# Patient Record
Sex: Female | Born: 1955 | Race: White | Hispanic: No | Marital: Married | Smoking: Never smoker
Health system: Southern US, Academic
[De-identification: ages and names within clinical notes are randomized; demographics above are authoritative.]

## PROBLEM LIST (undated history)

## (undated) DIAGNOSIS — E11319 Type 2 diabetes mellitus with unspecified diabetic retinopathy without macular edema: Secondary | ICD-10-CM

## (undated) DIAGNOSIS — E139 Other specified diabetes mellitus without complications: Secondary | ICD-10-CM

## (undated) DIAGNOSIS — I10 Essential (primary) hypertension: Secondary | ICD-10-CM

## (undated) DIAGNOSIS — E785 Hyperlipidemia, unspecified: Secondary | ICD-10-CM

## (undated) DIAGNOSIS — E109 Type 1 diabetes mellitus without complications: Secondary | ICD-10-CM

## (undated) HISTORY — PX: TRIGGER FINGER RELEASE: SHX641

## (undated) HISTORY — PX: HX TONSILLECTOMY: SHX27

## (undated) HISTORY — DX: Type 2 diabetes mellitus with unspecified diabetic retinopathy without macular edema (CMS HCC): E11.319

## (undated) HISTORY — PX: HX WISDOM TEETH EXTRACTION: SHX21

## (undated) HISTORY — PX: ENDOMETRIAL ABLATION: SHX621

## (undated) HISTORY — DX: Other specified diabetes mellitus without complications (CMS HCC): E13.9

## (undated) HISTORY — DX: Hyperlipidemia, unspecified: E78.5

---
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
## 2003-02-22 ENCOUNTER — Ambulatory Visit (HOSPITAL_COMMUNITY): Payer: Self-pay

## 2004-02-20 ENCOUNTER — Ambulatory Visit (HOSPITAL_COMMUNITY): Payer: Self-pay

## 2005-03-01 ENCOUNTER — Ambulatory Visit (HOSPITAL_COMMUNITY): Payer: Self-pay

## 2006-08-14 ENCOUNTER — Other Ambulatory Visit (HOSPITAL_COMMUNITY): Payer: Self-pay

## 2007-03-24 ENCOUNTER — Ambulatory Visit
Admission: RE | Admit: 2007-03-24 | Discharge: 2007-03-24 | Disposition: A | Discharge status: Home / Self Care | Payer: BC Managed Care – PPO

## 2007-03-27 ENCOUNTER — Ambulatory Visit (HOSPITAL_COMMUNITY): Payer: Self-pay

## 2009-10-05 ENCOUNTER — Other Ambulatory Visit (HOSPITAL_COMMUNITY): Payer: Self-pay

## 2009-10-18 ENCOUNTER — Ambulatory Visit (HOSPITAL_COMMUNITY): Payer: BC Managed Care – PPO

## 2009-11-15 ENCOUNTER — Ambulatory Visit
Admission: RE | Admit: 2009-11-15 | Discharge: 2009-11-15 | Disposition: A | Discharge status: Home / Self Care | Payer: BC Managed Care – PPO

## 2009-11-15 DIAGNOSIS — Z1231 Encounter for screening mammogram for malignant neoplasm of breast: Secondary | ICD-10-CM | POA: Insufficient documentation

## 2009-11-16 ENCOUNTER — Other Ambulatory Visit (HOSPITAL_COMMUNITY): Payer: Self-pay

## 2009-11-22 ENCOUNTER — Ambulatory Visit
Admission: RE | Admit: 2009-11-22 | Discharge: 2009-11-22 | Disposition: A | Discharge status: Home / Self Care | Payer: BC Managed Care – PPO

## 2011-04-19 ENCOUNTER — Other Ambulatory Visit (HOSPITAL_COMMUNITY): Payer: Self-pay

## 2011-05-13 ENCOUNTER — Ambulatory Visit
Admission: RE | Admit: 2011-05-13 | Discharge: 2011-05-13 | Disposition: A | Discharge status: Home / Self Care | Payer: BC Managed Care – PPO | Source: Ambulatory Visit

## 2011-05-13 ENCOUNTER — Ambulatory Visit (HOSPITAL_BASED_OUTPATIENT_CLINIC_OR_DEPARTMENT_OTHER)
Admission: RE | Admit: 2011-05-13 | Discharge: 2011-05-13 | Disposition: A | Discharge status: Home / Self Care | Payer: BC Managed Care – PPO | Source: Ambulatory Visit

## 2011-05-13 DIAGNOSIS — Z13820 Encounter for screening for osteoporosis: Secondary | ICD-10-CM | POA: Insufficient documentation

## 2011-05-13 DIAGNOSIS — M899 Disorder of bone, unspecified: Secondary | ICD-10-CM | POA: Insufficient documentation

## 2011-05-13 DIAGNOSIS — Z780 Asymptomatic menopausal state: Secondary | ICD-10-CM | POA: Insufficient documentation

## 2011-05-13 DIAGNOSIS — Z1231 Encounter for screening mammogram for malignant neoplasm of breast: Secondary | ICD-10-CM | POA: Insufficient documentation

## 2011-05-13 DIAGNOSIS — M949 Disorder of cartilage, unspecified: Secondary | ICD-10-CM

## 2012-05-07 ENCOUNTER — Other Ambulatory Visit (HOSPITAL_COMMUNITY): Payer: Self-pay

## 2012-09-23 ENCOUNTER — Emergency Department
Admission: EM | Admit: 2012-09-23 | Discharge: 2012-09-23 | Disposition: A | Discharge status: Home / Self Care | Payer: BC Managed Care – PPO | Attending: Emergency Medicine | Admitting: Emergency Medicine

## 2012-09-23 ENCOUNTER — Emergency Department (EMERGENCY_DEPARTMENT_HOSPITAL): Payer: BC Managed Care – PPO | Admitting: Radiology

## 2012-09-23 ENCOUNTER — Encounter (HOSPITAL_COMMUNITY): Payer: Self-pay

## 2012-09-23 ENCOUNTER — Emergency Department (HOSPITAL_COMMUNITY): Payer: BC Managed Care – PPO

## 2012-09-23 DIAGNOSIS — E119 Type 2 diabetes mellitus without complications: Secondary | ICD-10-CM | POA: Insufficient documentation

## 2012-09-23 DIAGNOSIS — S93409A Sprain of unspecified ligament of unspecified ankle, initial encounter: Secondary | ICD-10-CM | POA: Insufficient documentation

## 2012-09-23 DIAGNOSIS — W010XXA Fall on same level from slipping, tripping and stumbling without subsequent striking against object, initial encounter: Secondary | ICD-10-CM | POA: Insufficient documentation

## 2012-09-23 DIAGNOSIS — S93609A Unspecified sprain of unspecified foot, initial encounter: Secondary | ICD-10-CM | POA: Insufficient documentation

## 2012-09-23 DIAGNOSIS — I10 Essential (primary) hypertension: Secondary | ICD-10-CM | POA: Insufficient documentation

## 2012-09-23 HISTORY — DX: Essential (primary) hypertension: I10

## 2012-09-23 MED ADMIN — oxyCODONE-acetaminophen 5 mg-325 mg tablet: 2 | ORAL | NDC 68084035511

## 2012-09-23 MED FILL — oxyCODONE-acetaminophen 5 mg-325 mg tablet: 2.0000 | ORAL | Qty: 2 | Status: AC

## 2012-09-23 NOTE — ED Nurses Note (Signed)
Discharge instructions explained. X prescriptions given. No further questions. Pt ambulated out of ED.

## 2012-09-23 NOTE — ED Attending Note (Signed)
Note begun by:  Serina Cowper, MD 09/23/2012, 6:07 AM    I was physically present and directly supervised this patient's care.  Patient seen and examined with Dr Cornelius Moras.  Resident history and exam reviewed.   Key elements in addition to and/or correction of that documentation are as follows:    HPI :    57 y.o. female presents with chief complaint of L ankle pain after tripping last night. Unable to bear weight.     PE :   VS on presentation: Blood pressure 148/88, pulse 106, temperature 37 C (98.6 F), resp. rate 18, height 1.549 m (5\' 1" ), weight 68.947 kg (152 lb), SpO2 98.00%.  I have seen and examined with Dr Cornelius Moras and agree with her exam as relayed verbally to me, except as subsequently noted.     Data/Test :    EKG : None  Images Review by me : see list  Image Reports Review by me : As above  Labs : None    Review of Prior Data :       Prior Images : None  Prior EKG : None  Online Medical Records : [facility]  Transfer Docs/Images : None    Clinical Impression :   1. Sprain L ankle and foot.      ED Course :   Per Dr Cornelius Moras     Plan :   Per Dr Cornelius Moras    Dispo :   Per Dr Cornelius Moras    CRITICAL CARE : None

## 2012-09-23 NOTE — ED Nurses Note (Signed)
Pt came to the ED with a C/O left ankle/ foot pain.  Pt was camping and tripped over her daughters shoe 2100 7/1.  The pain continued to get worse and she called the EMS. Pt is AAOx3.  MAE, +CMS to the LLE.  Pt was seen by the Provider.  Orders obtained for Xray.

## 2012-09-23 NOTE — ED Provider Notes (Signed)
CC: left ankle pain    HPI:  Isabella Terrell is a 57 y.o. female with history of HTN and DM who presents with left ankle pain s/p tripping over a shoe last night.  Reports immediate onset pain that has progressively worsened.  She was unable to bear weight when she woke up to use the restroom this morning.  Denies numbness or tingling.        ROS:  Constitutional: No fever or weakness   Skin: No rash or diaphoresis  HENT: No headaches or congestion  Eyes: No vision changes   Cardio: No chest pain, palpitations or leg swelling   Respiratory: No cough, wheezing or SOB  GI:  No nausea, vomiting, diarrhea, constipation  GU:  No dysuria, hematuria, polyuria  MSK: + left foot and ankle pain No back pain  Neuro: No loss of sensation, confusion, focal deficits  Psychiatric: No mood changes  All other systems reviewed and are negative.    Medications:  Medications Prior to Admission    Outpatient Medications    atorvastatin (LIPITOR) 10 mg Oral Tablet    Take 10 mg by mouth Once a day    lisinopril (PRINIVIL) 10 mg Oral Tablet    Take 10 mg by mouth Once a day    MetFORMIN (GLUCOPHAGE) 1,000 mg Oral Tablet    Take 1,000 mg by mouth Twice daily with food          Allergies:  Not on File  Allergies reviewed with patient    Past Medical History   Diagnosis Date   . HTN (hypertension)    . Diabetes mellitus      History reviewed with patient    History reviewed. No pertinent past surgical history.    History     Social History   . Marital Status: Married     Spouse Name: N/A     Number of Children: N/A   . Years of Education: N/A     Occupational History   . Not on file.     Social History Main Topics   . Smoking status: Never Smoker    . Smokeless tobacco: Not on file   . Alcohol Use: No   . Drug Use: No   . Sexually Active: Not on file     Other Topics Concern   . Not on file     Social History Narrative   . No narrative on file       No family history on file.    Physical Exam:  All nurse's notes reviewed.   ED Triage Vitals   Enc Vitals Group      BP (Non-Invasive) 09/23/12 0518 148/88 mmHg      Heart Rate 09/23/12 0518 106      Respiratory Rate 09/23/12 0518 18      Temperature 09/23/12 0518 37 C (98.6 F)      Temp src --       SpO2-1 09/23/12 0518 98 %      Weight 09/23/12 0518 68.947 kg (152 lb)      Height 09/23/12 0518 1.549 m (5\' 1" )      Head Cir --       Peak Flow --       Pain Score --       Pain Loc --       Pain Edu? --       Excl. in GC? --      Constitutional: NAD. Oriented  HENT:   Head: Normocephalic and atraumatic.   Mouth/Throat: Oropharynx is clear and moist.   Eyes: PERRL, EOMI, conjunctivae without discharge bilaterally  Neck: Trachea midline.   Cardiovascular: RRR, No murmurs, rubs or gallops.   Pulmonary/Chest: BS equal bilaterally, good air movement. No respiratory distress. No wheezes, rales or chest tenderness.   Abdominal: BS +. Abdomen soft, no tenderness, rebound or guarding.               Musculoskeletal: No edema, tenderness or deformity.  +TTP to medial arch of left foot, 5th metatarsal and anterior portion of lateral malleolus.  Denies TTP of tib/fib, knee, thigh or hip.  SILT.  Brisk cap refill  Skin: warm and dry. No rash, erythema, pallor or cyanosis  Psychiatric: normal mood and affect. Behavior is normal.   Neurological: Alert&Ox3. Grossly intact.     Labs:  none    Imaging:  Results for orders placed during the hospital encounter of 09/23/12 (from the past 72 hour(s))   XR ANKLE LEFT     Status: Normal    Narrative:     XR ANKLE LEFT performed on Neldon Mc Knieriem on Sep 23, 2012  6:17 AM.     CLINICAL HISTORY: 56 y.o. female with  twisted ankle.    TECHNIQUE: XR ANKLE LEFT    COMPARISON: None    FINDINGS: 3 views of the left ankle were obtained.  The left ankle mortise   and syndesmosis are well-preserved.  No fracture, dislocation,   subluxation, or destructive lesion is present.  No joint effusion is   present.  No significant soft tissue swelling is present.       Impression:          No acute osseous abnormality.             XR FOOT WEIGHT BEARING LT     Status: Normal    Narrative:     XR FOOT WEIGHT BEARING LT performed on Neldon Mc Klapper on Sep 23, 2012    6:17 AM.     CLINICAL HISTORY: 57 y.o. female with  ankle injury.    TECHNIQUE: XR FOOT WEIGHT BEARING LT    COMPARISON: None.    FINDINGS: 3 views of the left foot were obtained.  The forefoot, midfoot,   and hindfoot alignment are well preserved.  No fracture, dislocation,   subluxation, or destructive lesion is present.  An accessory navicular is   present.  No significant soft tissue swelling is present in the left foot.      Impression:          No acute osseous abnormality.             The differential diagnosis for this patient includes acute ankle fracture vs strain. The following orders have been placed.    Orders Placed This Encounter   . XR ANKLE LEFT   . XR FOOT WEIGHT BEARING LT   . oxyCODONE-acetaminophen (PERCOCET) 5-325mg  per tablet   . HYDROcodone-acetaminophen (NORCO) 5-325 mg Oral Tablet     Plan: Appropriate labs and imaging ordered. Medical Records reviewed.    Therapy/Procedures/Course/MDM: Patient was vitally stable throughout visit.  Required  medical therapy for pain control. Patient had  improvement in symptoms over course of ED stay.  Imaging showed no acute bony abnormality.  Results were discussed with patient. They were given an opportunity to ask questions.  Consults: none  Impression: ankle sprain  Disposition:       Following  the above history, physical exam, and studies, the patient was deemed stable and suitable for discharge and she will follow up in 1 week with PCP.  Follow up with orthopedics was also provided.  Prescriptions were written for norco.  She was placed in an ACE wrap and crutch training was provided.   Medication instructions were discussed with the patient/patient's family. Patient was advised to return to the ED with any new, concerning or worsening symptoms and follow up as directed. The patient verbalized understanding of all instructions and had no further questions or concerns.     Honor Loh, MD 09/23/2012, 5:27 AM  [facility]

## 2012-09-23 NOTE — ED Nurses Note (Signed)
Pt to Xray.

## 2012-10-12 ENCOUNTER — Encounter (INDEPENDENT_AMBULATORY_CARE_PROVIDER_SITE_OTHER): Payer: Self-pay | Admitting: ORTHOPEDIC, SPORTS MEDICINE

## 2012-10-12 ENCOUNTER — Ambulatory Visit: Payer: BC Managed Care – PPO | Attending: ORTHOPEDIC, SPORTS MEDICINE | Admitting: ORTHOPEDIC, SPORTS MEDICINE

## 2012-10-12 VITALS — BP 116/76 | HR 95 | Ht 61.0 in | Wt 148.8 lb

## 2012-10-12 DIAGNOSIS — S93629A Sprain of tarsometatarsal ligament of unspecified foot, initial encounter: Secondary | ICD-10-CM | POA: Insufficient documentation

## 2012-10-12 DIAGNOSIS — I10 Essential (primary) hypertension: Secondary | ICD-10-CM | POA: Insufficient documentation

## 2012-10-12 DIAGNOSIS — E119 Type 2 diabetes mellitus without complications: Secondary | ICD-10-CM | POA: Insufficient documentation

## 2012-10-12 NOTE — Progress Notes (Signed)
Name: Beatric Fulop [facility]  MEDICAL RECORD [id number]  DATE OF BIRTH: [birth year]  DATE OF SERVICE: 10/12/2012       SUBJECTIVE:  Isabella Terrell is a 57 y.o. year old who presents to clinic today for evaluation of her left foot.     Pain has been present for 3 weeks. There has been antecedent trauma. Date of Injury (DOI) = 7/2. Mechanism of injury was: forced plantarflexion and inversion.  The pain is localized to the lateral midfoot.  Worsened with weight bearing. Associated symptoms include: numbness over lateral midfoot. Prior treatments include: rest. This has helped.    PAST MEDICAL HISTORY  Past Medical History   Diagnosis Date   . HTN (hypertension)    . Diabetes mellitus       PAST SURGICAL HISTORY  No past surgical history on file.  MEDICATIONS  Current Outpatient Prescriptions   Medication Sig   . atorvastatin (LIPITOR) 10 mg Oral Tablet Take 10 mg by mouth Once a day   . lisinopril (PRINIVIL) 10 mg Oral Tablet Take 10 mg by mouth Once a day   . MetFORMIN (GLUCOPHAGE) 1,000 mg Oral Tablet Take 1,000 mg by mouth Twice daily with food   . SITAGLIPTIN PHOSPHATE (JANUVIA ORAL) Take by mouth     ALLERGIES  Not on File  SOCIAL HISTORY  History     Social History   . Marital Status: Married     Spouse Name: N/A     Number of Children: N/A   . Years of Education: N/A     Occupational History   . Not on file.     Social History Main Topics   . Smoking status: Never Smoker    . Smokeless tobacco: Not on file   . Alcohol Use: No   . Drug Use: No   . Sexually Active: Not on file     Other Topics Concern   . Not on file     Social History Narrative   . No narrative on file     FAMILY HISTORY  No family history on file.  REVIEW OF SYSTEMS  As above, otherwise all other systems are essentially negative.    PHYSICAL EXAM  BP 116/76   Pulse 95   Ht 1.549 m (5\' 1" )   Wt 67.5 kg (148 lb 13 oz)   BMI 28.13 kg/m2    In no acute distress with appropriate mood and affect.  She is alert and oriented times three.  HEENT: NC/AT Pupils are equal and round.  PULM: respirations are unlabored.  CV: Pulses palpable and equal distally.  GI: Abdomen is nondistended.  DERM: Skin is warm and dry to touch.    left ankle, foot:  Range of Motion: full  Strength: 4/5 with eversion, otherwise full  Tenderness to Palpation: base of 4th and 5th MT  Swelling: minimal  Special Test: ligaments stable  Neurologic: decreased sensation over lateral midfoot, otherwise intact  Gait: within normal limits     RADIOGRAPHY:  Plain film x-rays of the patient's left ankle, foot were reviewed in clinic today and show no acute bony abnormalities.    ASSESSMENT:     ICD-9-CM    1. Sprain of tarsometatarsal ligament of foot 845.11 DME CUSTOM MOLDED ORTHOSIS (REQUISITION/REQ     PLAN:  At today's visit, we discussed treatment options concerning patient's left foot.  Midfoot sprain.  Recommend rigid insert for additonal supprot.  Seems to be healing well.    She  will f/u in clinic in 3 wks if not improving as anticipated..  If the patient has any questions or concerns prior to next visit, she can contact our office.        Hilda Lias, MD 10/12/2012, 3:52 PM

## 2013-04-01 ENCOUNTER — Other Ambulatory Visit (HOSPITAL_COMMUNITY): Payer: Self-pay

## 2013-04-01 DIAGNOSIS — Z1231 Encounter for screening mammogram for malignant neoplasm of breast: Secondary | ICD-10-CM

## 2013-07-17 ENCOUNTER — Ambulatory Visit (HOSPITAL_COMMUNITY): Payer: Self-pay | Admitting: Family

## 2013-09-07 ENCOUNTER — Ambulatory Visit
Admission: RE | Admit: 2013-09-07 | Discharge: 2013-09-07 | Disposition: A | Discharge status: Home / Self Care | Payer: BC Managed Care – PPO | Source: Ambulatory Visit

## 2013-09-07 DIAGNOSIS — Z1231 Encounter for screening mammogram for malignant neoplasm of breast: Secondary | ICD-10-CM | POA: Insufficient documentation

## 2013-09-07 DIAGNOSIS — N6489 Other specified disorders of breast: Secondary | ICD-10-CM | POA: Insufficient documentation

## 2014-08-09 ENCOUNTER — Encounter (HOSPITAL_COMMUNITY): Payer: Self-pay

## 2014-08-10 ENCOUNTER — Encounter (HOSPITAL_COMMUNITY): Payer: Self-pay

## 2014-10-10 ENCOUNTER — Encounter (HOSPITAL_COMMUNITY): Payer: Self-pay

## 2015-10-10 ENCOUNTER — Other Ambulatory Visit (HOSPITAL_COMMUNITY): Payer: Self-pay

## 2015-10-10 DIAGNOSIS — Z1231 Encounter for screening mammogram for malignant neoplasm of breast: Secondary | ICD-10-CM

## 2016-07-10 ENCOUNTER — Ambulatory Visit
Admission: RE | Admit: 2016-07-10 | Discharge: 2016-07-10 | Disposition: A | Discharge status: Home / Self Care | Payer: No Typology Code available for payment source | Source: Ambulatory Visit

## 2016-07-10 ENCOUNTER — Encounter (HOSPITAL_COMMUNITY): Payer: Self-pay

## 2016-07-10 DIAGNOSIS — Z1231 Encounter for screening mammogram for malignant neoplasm of breast: Secondary | ICD-10-CM | POA: Insufficient documentation

## 2016-12-17 ENCOUNTER — Encounter (HOSPITAL_BASED_OUTPATIENT_CLINIC_OR_DEPARTMENT_OTHER): Payer: Self-pay | Admitting: "Endocrinology

## 2016-12-17 ENCOUNTER — Ambulatory Visit (HOSPITAL_BASED_OUTPATIENT_CLINIC_OR_DEPARTMENT_OTHER): Payer: No Typology Code available for payment source

## 2016-12-17 ENCOUNTER — Ambulatory Visit: Payer: No Typology Code available for payment source | Attending: "Endocrinology | Admitting: "Endocrinology

## 2016-12-17 VITALS — BP 142/102 | HR 98 | Ht 61.0 in | Wt 156.7 lb

## 2016-12-17 DIAGNOSIS — E119 Type 2 diabetes mellitus without complications: Secondary | ICD-10-CM

## 2016-12-17 DIAGNOSIS — E041 Nontoxic single thyroid nodule: Secondary | ICD-10-CM | POA: Insufficient documentation

## 2016-12-17 DIAGNOSIS — I10 Essential (primary) hypertension: Secondary | ICD-10-CM | POA: Insufficient documentation

## 2016-12-17 DIAGNOSIS — E1142 Type 2 diabetes mellitus with diabetic polyneuropathy: Secondary | ICD-10-CM | POA: Insufficient documentation

## 2016-12-17 DIAGNOSIS — E785 Hyperlipidemia, unspecified: Secondary | ICD-10-CM | POA: Insufficient documentation

## 2016-12-17 DIAGNOSIS — E1165 Type 2 diabetes mellitus with hyperglycemia: Secondary | ICD-10-CM | POA: Insufficient documentation

## 2016-12-17 DIAGNOSIS — Z794 Long term (current) use of insulin: Secondary | ICD-10-CM | POA: Insufficient documentation

## 2016-12-17 DIAGNOSIS — Z79899 Other long term (current) drug therapy: Secondary | ICD-10-CM | POA: Insufficient documentation

## 2016-12-17 DIAGNOSIS — E11319 Type 2 diabetes mellitus with unspecified diabetic retinopathy without macular edema: Secondary | ICD-10-CM | POA: Insufficient documentation

## 2016-12-17 LAB — BASIC METABOLIC PANEL
ANION GAP: 11 mmol/L (ref 4–13)
BUN/CREA RATIO: 16 (ref 6–22)
BUN: 13 mg/dL (ref 8–25)
CALCIUM: 10.4 mg/dL — ABNORMAL HIGH (ref 8.5–10.2)
CHLORIDE: 100 mmol/L (ref 96–111)
CO2 TOTAL: 26 mmol/L (ref 22–32)
CREATININE: 0.81 mg/dL (ref 0.49–1.10)
CREATININE: 0.81 mg/dL (ref 0.49–1.10)
ESTIMATED GFR: >59 mL/min/1.73mˆ2 (ref >59)
GLUCOSE: 466 mg/dL (ref 65–139)
POTASSIUM: 4 mmol/L (ref 3.5–5.1)
SODIUM: 137 mmol/L (ref 136–145)

## 2016-12-17 LAB — MICROALBUMIN/CREATININE RATIO, URINE, RANDOM
MICROALBUMIN RANDOM URINE: 0.7 mg/dL (ref 0.0–2.0)
MICROALBUMIN/CREATININE RATIO RANDOM URINE: 22.6 mg/g (ref <=30.0)

## 2016-12-17 LAB — THYROID STIMULATING HORMONE (SENSITIVE TSH): TSH: 1.887 u[IU]/mL (ref 0.350–5.000)

## 2016-12-17 MED ORDER — GLUCAGON (HUMAN RECOMBINANT) 1 MG/ML SOLUTION FOR INJECTION: 1 mg | Kit | Freq: Once | INTRAMUSCULAR | 3 refills | 0 days | Status: AC | PRN

## 2016-12-17 MED ORDER — LANCETS 33 GAUGE
3 refills | Status: AC
Start: 2016-12-17 — End: ?

## 2016-12-17 MED ORDER — BLOOD SUGAR DIAGNOSTIC STRIPS
ORAL_STRIP | 3 refills | Status: AC
Start: 2016-12-17 — End: ?

## 2016-12-17 MED ORDER — FLASH GLUCOSE SENSOR KIT: Each | 3 refills | 0 days | Status: DC

## 2016-12-17 MED ORDER — PEN NEEDLE, DIABETIC 31 GAUGE X 3/16": Each | 3 refills | 0 days | Status: DC

## 2016-12-17 MED ORDER — FLASH GLUCOSE SCANNING READER: Each | 0 refills | 0 days | Status: DC

## 2016-12-17 NOTE — Patient Instructions (Signed)
We will continue your Januvia 100 mg daily and increase metformin to 1000 mg twice a day.   Continue Lantus 24 units nightly but I want you to check your sugars every morning and increase your Lantus by 2 units every 2 days until your morning sugars are less than 140. If you feel really bad with your sugars in the 140s, then we can aim for morning sugars in the 160s-180s instead.

## 2016-12-17 NOTE — H&P (Signed)
Isabella Terrell  NEW PATIENT H&P    Referral Reason:  Type II Diabetes Mellitus   Referring Provider: Peri Jefferson  PCP: Peri Jefferson    HPI:   Isabella Terrell is a 61 y.o. female with PMH of HTN, HLD who presents as a referral from her gynecologist due to recurrent UTI/yeast infections for evaluation and management of poorly-controlled type II diabetes mellitus. Patient was diagnosed with type II diabetes mellitus about 25 years ago after presenting to her PCP with complaint of polyuria. She has never been hospitalized for high or low sugars. Patient was having multiple UTI and yeast infections so her gynecologist checked her HgA1C on 11/21/16 which was elevated to 12.6%. She does not check her sugars. She does not own a glucometer and states that she has expired test strips. She does not think she ever gets hypoglycemia as she used to have problems with hypoglycemia 15-17 years ago and would feel woozy and eyes would glaze over. She does not have a glucagon kit and when she would get low many years ago, she was able to treat them with food and juice. She reports numbness/tingling in her feet but reports good sensation and denies wounds. She gets yearly diabetes eye exams with Dr. Lamona Curl in [place], [place] and has diabetic retinopathy for which she receives eye injections. She is currently taking metformin 1000 mg qPM (states she frequently forgets to take it), Januvia 100 mg qPM, and Lantus 24 units qPM. She has been having social difficulties lately as she is currently retired and caring for 4 children (one is her grandchild, one is an adopted daughter, and two are children she has temporary custody for). She states that the children are not well behaved and she has difficulty with them. She used to be more active and walk about 10,000-13,000 steps a day but now is walking about 7,000 steps a day. She eats about 4 times a day but does not eat much. She does not cook much as she is busy with  the kids so they eat lots of easy to make things like sandwiches and waffles. She does enjoy eating salads. She is worried about gaining weight with insulin adjustments but states her weight is stable at this time. She is interested in seeing diabetes education/dietician. She states she has seen Dr. Alphonzo Dublin at [facility] in the past but does not have a reason why she did not keep her follow ups.     DIABETIC COMPLICATIONS:  Microvascular Complications:    Peripheral/Autonomic Neuropathy: +Mild peripheral neuropathy, not currently on medication   Renal: Last GFR- none on file, Microalb/crt ratio- none on file. Not currently on ACEI/ARB   Retinopathy: Last eye exam 06/2016. Has diabetic retinopathy requiring eye injections. Follows with Dr. Lamona Curl in [place], [place]  Macrovascular Complications:   Hx of CAD: No   Hx of statin/ASA: On Lipitor, no aspirin; SGLT2 or GLP-1 therapy? No   Hx of Stroke: No   Hx of HTN: Yes   Hx of HLD: Yes    CURRENT ANTI-DIABETIC REGIMEN:  Patient is adherent to meds   Januvia 100 mg qPM   Metformin 1000 mg qPM   Lantus 24 units qPM    PREVIOUS ANTI-DIABETIC MEDS:   None    BLOOD GLUCOSE TRENDS: Testing BG 0 times per day   Time Readings   Before Breakfast None   Before Lunch None   Before Supper None   Bedtime  None     HYPOGLYCEMIA  EVALUATION:  Is it occurring? Symptoms? Unsure but doesn't think so; symptoms in past 15-17 years ago were feeling woozy and eyes glazing over   Hypoglycemic Event: (Date/Time) 15-17 years ago   Treatment: Food, juice   Assisted or Unassisted: Unassisted   Hypoglycemic unawareness? Not in past but unsure now     NUTRITION:  Last Visit to Diabetic Education: Many years ago   Diet:  Tries to eat easy to make foods like sandwiches, waffles. Eats salads   Is patient drinking sugar sweetened beverages? Not much     WEIGHT:  Baseline adult weight    Current weight  Body mass index is 29.62 kg/(m^2).      REVIEW OF SYSTEMS:  In addition to HPI...  Constitutional:  negative  Eyes: negative  Ears, nose, mouth, throat, and face: negative  Respiratory: negative  Cardiovascular: negative  Gastrointestinal: negative  Genitourinary:negative  Integument/breast: negative  Hematologic/lymphatic: negative  Musculoskeletal:negative  Neurological: negative  Behavioral/Psych: negative  Endocrine: negative  Allergic/Immunologic: negative  All other ROS Negative    Past Medical History:   Diagnosis Date   . HLD (hyperlipidemia)    . HTN (hypertension)    . Type II diabetes mellitus (CMS HCC)      Past Surgical History:   Procedure Laterality Date   . ENDOMETRIAL ABLATION     . HX WISDOM TEETH EXTRACTION     . TRIGGER FINGER RELEASE       Social History     Social History   . Marital status: Married     Spouse name: N/A   . Number of children: N/A   . Years of education: N/A     Occupational History   . Not on file.     Social History Main Topics   . Smoking status: Never Smoker   . Smokeless tobacco: Never Used   . Alcohol use No   . Drug use: No   . Sexual activity: Not on file     Other Topics Concern   . Not on file     Social History Narrative     Family Medical History     Problem Relation (Age of Onset)    Diabetes type II Father, Brother, Paternal Uncle, Sister    Hypothyroidism Mother, Sister    Multiple Sclerosis Brother        Current Outpatient Prescriptions   Medication Sig   . atorvastatin (LIPITOR) 10 mg Oral Tablet Take 20 mg by mouth Once a day    . Blood Sugar Diagnostic (ONETOUCH VERIO) Strip To check sugars 4 times daily as directed   . flash glucose scanning reader (FREESTYLE LIBRE 10 DAY READER) Does not apply Misc To check sugars 4 times daily as directed   . flash glucose sensor (FREESTYLE LIBRE 10 DAY SENSOR) Does not apply Kit To check sugars 4 times daily as directed   . glucagon (GLUCAGEN) 1 mg/mL Injection Recon Soln 1 mg by Intramuscular route Once, as needed for Other (Blood sugar less than 70 and cannot eat) for up to 1 dose   . insulin glargine (LANTUS  SOLOSTAR U-100 INSULIN) 100 unit/mL Subcutaneous Insulin Pen 24 Units by Subcutaneous route Every night   . Insulin Needles, Disposable, (BD ULTRAFINE III SHORT PEN) 31 gauge x 3/16" Needle As needed daily with Lantus   . lancets (ONE TOUCH DELICA) 33 gauge Misc To check sugars 4 times daily as directed   . lisinopril (PRINIVIL) 10 mg Oral Tablet Take 10 mg  by mouth Once a day   . MetFORMIN (GLUCOPHAGE) 1,000 mg Oral Tablet Take 1,000 mg by mouth Twice daily with food   . SITAGLIPTIN PHOSPHATE (JANUVIA ORAL) Take by mouth     No Known Allergies    PHYSICAL EXAM:   BP (!) 142/102  Pulse 98  Ht 1.549 m (_0 )  Wt 71.1 kg (156 lb 12 oz)  BMI 29.62 kg/m2   General: Well appearing female in no acute distress.  Psychiatric: Normal mood and affect  Neuro: Alert and oriented x 3. Speech fluent. Cranial nerves II-XII intact. No focal deficits noted. Sensation intact throughout.   HEENT: Head normocephalic, atraumatic. PERRLA, EOMI, conjunctiva clear. Oral mucosa pink and moist.   Neck: No lymphadenopathy or thyromegaly. Palpable right thyroid nodule.   Lungs: Clear to auscultation bilaterally. Normal respiratory effort.   Cardiac: Regular rate and rhythm, normal S1 and S2, without murmurs, rubs, or gallops.  Abdomen: Soft, non-tender, non-distended.   Extremities: No edema, erythema, warmth, or tenderness.  Skin: Warm and dry.  Diabetic foot exam:  Both feet without edema or ulcerations. Pulses normal bilaterally. Sensation normal bilaterally      IMAGING: N/A    LABS:  No results found for: HA1C, SODIUM, POTASSIUM, CHLORIDE, CO2, BUN, CREATININE, GLUCOSE, MICALBCRERAT, TRIG, HDLCHOL, LDLCHOL, CHOLESTEROL, AST, ALT    -Outside labs reviewed: HgA1C 12.6% on 11/21/16    ASSESSMENT:  Isabella Terrell is a 61 y.o. female with PMH of HLD who presents as a referral from her gynecologist due to recurrent UTI/yeast infections for evaluation and management of poorly-controlled type II diabetes mellitus.     PLAN:   Poorly-Controlled Type II Diabetes Mellitus  -Complicated by poor compliance with checking sugars, peripheral neuropathy, past microalbuminuria, diabetic retinopathy and macrovascular diseases including HTN, HLD  -Will check anti-GAD and anti-islet cell antibodies to evaluate for type 1 DM  -Most recent HgA1C 12.6% in 10/2016. Urine microalbumin/creatinine ratio- will check today. Not currently on ACEi/ARB but was on lisinopril in the past. If microalbumin/Cr ratio elevated, will restart lisinopril   -Most recent diabetes foot exam today, 12/17/2016  -Most recent diabetic eye exam 06/2016. Has diabetic retinopathy requiring eye injections. Follows with Dr. Lamona Curl in [place], [place]  -Hypoglycemia not occurring. Sent glucagon injection  -Will continue Januvia 100 mg qDaily and increase metformin to 1000 mg BID  -Will continue Lantus at 24 units qPM and instructed patient to check sugars every morning and increase her Lantus by 2 units every 2 days until blood sugars <140 most days. Instructed her that if she feels bad with sugars in the 140s, we can aim for a goal AM sugar of 160-180s for the next month or two if needed until her body acclimates to better sugars  -Provided with OneTouch Verio glucometer. Will send test strips, lancets, pen needles to pharmacy  -Will check BMP to evaluate kidney function and electrolytes  -Advised patient to start checking sugars 2x/day and to bring sugar logs and glucometer to clinic appointments  -Sent prescription for FreeStyle Libre sensors and reader  -Discussed patient with endocrine nurse, Selena Batten, and provided with glucometer     Return to diabetic education; ordered external referral   Hgba1c Goal <7%   Preprandial Glucose Target 80-130 mg/dL   Peak Postprandial Glucose Target <180 mg/dL    BP Goal: <130/80    Labs today: TSH, BMP, anti-GAD and islet cell antibodies, urine microalbumin/Cr ratio   Vaccines:   Flu vaccine annually- discuss with PCP  Hepatitis B- discuss with  PCP    Hepatitis B, 3-dose series to unvaccinated adults with DM>60 y/o    Hepatitis B, 3-dose series to unvaccinated adults with DM 26-59 y/o    Pneumonia- discuss with PCP    All patients with DM 2-64 y/o with pneumococcal polysaccharide vaccine (PPSV23)   At age 39 years, administer the pneumococcal conjugate vaccine (PCV 13) at least 1 year after vaccination with PPSV23, followed by another dose of vaccine PPSV23 at least 1 year after the last dose of PPSV23    Yearly eye and daily foot exams discussed   Symptoms of potential hypoglycemia discussed and treatment of hypoglycemia discussed   Return visit in 1 month or earlier if needed    The patient will be contacted once any lab work is resulted    CC: Notes to referring physician     Palpable Right Thyroid Nodule  -Ordered thyroid ultrasound. Will check TSH    Orders Placed This Encounter   . [facility] Thyroid US   . [facility] THYROID US   . Basic Metabolic Panel   . Tsh   . Microalbumin/Creatinine Ratio, Random Urine   . CANCELED: Microalbumin/Creatinine Ratio, Random Urine   . CANCELED: Tsh   . GAD65 AB ASSAY, S   . ISLET ANTIGEN 2 (IA-2) ANTIBODY, SERUM   . Referral to Diabetic Education   . glucagon (GLUCAGEN) 1 mg/mL Injection Recon Soln   . Insulin Needles, Disposable, (BD ULTRAFINE III SHORT PEN) 31 gauge x 3/16" Needle   . flash glucose scanning reader (FREESTYLE LIBRE 10 DAY READER) Does not apply Misc   . flash glucose sensor (FREESTYLE LIBRE 10 DAY SENSOR) Does not apply Kit   . lancets (ONE TOUCH DELICA) 59 gauge Misc   . Blood Sugar Diagnostic (ONETOUCH VERIO) Strip      Keith Rake, MD, MPH 12/17/2016, 13:07          I have seen this patient and reviewed the chart and examined the patient and agree with the above note. Shela Commons, MD 12/18/2016, 07:40

## 2016-12-18 ENCOUNTER — Ambulatory Visit
Payer: No Typology Code available for payment source | Attending: INTERNAL MEDICINE-ENDOCRINOLOGY-DIABETES AND METABOLISM

## 2016-12-18 ENCOUNTER — Other Ambulatory Visit (HOSPITAL_BASED_OUTPATIENT_CLINIC_OR_DEPARTMENT_OTHER): Payer: Self-pay | Admitting: "Endocrinology

## 2016-12-18 ENCOUNTER — Other Ambulatory Visit (HOSPITAL_COMMUNITY): Payer: Self-pay | Admitting: "Endocrinology

## 2016-12-18 DIAGNOSIS — E041 Nontoxic single thyroid nodule: Secondary | ICD-10-CM | POA: Insufficient documentation

## 2016-12-19 ENCOUNTER — Ambulatory Visit (HOSPITAL_BASED_OUTPATIENT_CLINIC_OR_DEPARTMENT_OTHER): Payer: Self-pay | Admitting: "Endocrinology

## 2016-12-19 NOTE — Telephone Encounter (Signed)
Regarding: Lab orders/RX question  ----- Message from Aldean Jewett sent at 12/19/2016 12:43 PM EDT -----  [place] [facility] [place] 1    Patient stated that the [facility] lab stated that they needed orders for patient to drop off her 24 hour urin catch. Patient also stated that with this one medication that she got that there is suppose to have a second part to it and that the pharmacy does not have it. The following medication is the one she was talking about. Please call to advise. Thank you.    glucagon (GLUCAGEN) 1 mg/mL Injection Recon Soln 1 Kit 3 12/17/2016      Sig - Route: 1 mg by Intramuscular route Once, as needed for Other (Blood sugar less than 70 and cannot eat) for up to 1 dose - Intramuscular    Class: E-Rx     Preferred Pharmacy     [id number]   [address] [place]   Phone: [phone]: [phone]   Not a 24 hour pharmacy; exact hours not known

## 2016-12-19 NOTE — Telephone Encounter (Signed)
Detailed vm left on pharmacy vm box for glucagon emergency kit. Message to provider to advise on urine testing as pt just did urine labs.  Enid Skeens, RN 12/19/2016 15:51

## 2016-12-20 ENCOUNTER — Telehealth (HOSPITAL_BASED_OUTPATIENT_CLINIC_OR_DEPARTMENT_OTHER): Payer: Self-pay | Admitting: "Endocrinology

## 2016-12-20 NOTE — Telephone Encounter (Signed)
[name], [name], MD  Asuncion Shibata, Lemar Livings, RN     Caller: Unspecified Burgess Estelle, 12:38 PM)                Jake Seats,     I did not order any urine tests except for the microalbumin/Cr ratio she already did yesterday. I'm not sure which urine test the lab is talking about.     Thank you,     Osborne Casco       Brief vm for return call to clinic.  Ernst Breach, RN 12/20/2016 09:01

## 2016-12-20 NOTE — Telephone Encounter (Signed)
Spoke with patient. Advised of note below. Verbalzied understanding. Denied any questions. Mitzy Naron, MA  12/20/2016, 15:59

## 2016-12-20 NOTE — Telephone Encounter (Signed)
-----   Message from Zadie Rhine, MD sent at 12/19/2016  8:03 AM EDT -----  TSH is good. Blood sugars elevated which we expected. Patient was instructed at appointment to start checking her sugars twice a day and start increasing Lantus from 24 units qPM by 2 units every 2 days until AM blood sugars are <140 or if she feels too bad with good sugars aim for AM sugars 160-180 for the first month or two. Urine microalbumin/Cr is okay. Will hold off on restarting lisinopril at this time. Suspect that she is indeed type 2 DM as her sugar was 466 without elevated anion gap. GAD and islet cell antibodies pending.     Gordy Levan, MD, MPH 12/19/2016, 08:03

## 2016-12-24 ENCOUNTER — Other Ambulatory Visit (HOSPITAL_COMMUNITY): Payer: Self-pay | Admitting: "Endocrinology

## 2016-12-24 DIAGNOSIS — E109 Type 1 diabetes mellitus without complications: Secondary | ICD-10-CM

## 2016-12-24 MED ORDER — INSULIN LISPRO (U-100) 100 UNIT/ML SUBCUTANEOUS PEN
5.00 [IU] | PEN_INJECTOR | Freq: Three times a day (TID) | SUBCUTANEOUS | 3 refills | Status: DC
Start: 2016-12-24 — End: 2017-03-11

## 2017-01-01 ENCOUNTER — Ambulatory Visit (HOSPITAL_BASED_OUTPATIENT_CLINIC_OR_DEPARTMENT_OTHER): Payer: Self-pay | Admitting: "Endocrinology

## 2017-01-01 NOTE — Telephone Encounter (Signed)
-----   Message from [facility] sent at 01/01/2017  1:12 PM EDT -----  Zadie Rhine, MD  Pt is calling needing to speak with you about her blood sugars running high.Please call pt back at [phone]

## 2017-01-01 NOTE — Telephone Encounter (Signed)
Spoke with patient and took log. Pt has Libre. Sugars ranging from 150-350. Pt states meter does not go past 350. She has d/ced Januvia and Metformin. She is on Lantus 30U and Humalog 6U TID. She states Lantus and Humalog is "jamming" and she has had to waste two pens. Pt states her pens inject however they do not dial all the way back to 0. Spoke with [employer]. Pt to open up new box and use pen from new box. Pt is not getting full dose of insulin when pen won't dial back to 0. Pt to call in one week with readings for adjustment. Advised pt. Also reviewed priming the pen with 2U with each injection to ensure pen needle is working and full dose of medication is being administered. If pt has issue with pen "jamming" again pt is to open new box and use new pen. Pt is to take faulty pens back to pharmacy. Company will replace pens. Pt verbalized understanding and will call in one week for adjustments.  Ernst Breach, RN 01/01/2017 15:14

## 2017-01-13 ENCOUNTER — Encounter (HOSPITAL_BASED_OUTPATIENT_CLINIC_OR_DEPARTMENT_OTHER): Payer: Self-pay | Admitting: "Endocrinology

## 2017-01-16 ENCOUNTER — Ambulatory Visit (HOSPITAL_BASED_OUTPATIENT_CLINIC_OR_DEPARTMENT_OTHER): Payer: No Typology Code available for payment source

## 2017-01-16 ENCOUNTER — Ambulatory Visit (HOSPITAL_BASED_OUTPATIENT_CLINIC_OR_DEPARTMENT_OTHER): Payer: No Typology Code available for payment source | Admitting: "Endocrinology

## 2017-01-16 ENCOUNTER — Encounter (HOSPITAL_BASED_OUTPATIENT_CLINIC_OR_DEPARTMENT_OTHER): Payer: Self-pay | Admitting: "Endocrinology

## 2017-01-16 ENCOUNTER — Ambulatory Visit
Admission: RE | Admit: 2017-01-16 | Discharge: 2017-01-16 | Disposition: A | Discharge status: Home / Self Care | Payer: No Typology Code available for payment source | Source: Ambulatory Visit | Attending: "Endocrinology | Admitting: "Endocrinology

## 2017-01-16 VITALS — BP 132/82 | HR 84 | Ht 61.0 in | Wt 155.0 lb

## 2017-01-16 DIAGNOSIS — E785 Hyperlipidemia, unspecified: Secondary | ICD-10-CM

## 2017-01-16 DIAGNOSIS — E041 Nontoxic single thyroid nodule: Secondary | ICD-10-CM | POA: Insufficient documentation

## 2017-01-16 DIAGNOSIS — E109 Type 1 diabetes mellitus without complications: Secondary | ICD-10-CM

## 2017-01-16 DIAGNOSIS — G629 Polyneuropathy, unspecified: Secondary | ICD-10-CM

## 2017-01-16 DIAGNOSIS — E1042 Type 1 diabetes mellitus with diabetic polyneuropathy: Secondary | ICD-10-CM | POA: Insufficient documentation

## 2017-01-16 DIAGNOSIS — E10319 Type 1 diabetes mellitus with unspecified diabetic retinopathy without macular edema: Secondary | ICD-10-CM | POA: Insufficient documentation

## 2017-01-16 DIAGNOSIS — E119 Type 2 diabetes mellitus without complications: Secondary | ICD-10-CM

## 2017-01-16 DIAGNOSIS — I10 Essential (primary) hypertension: Secondary | ICD-10-CM | POA: Insufficient documentation

## 2017-01-16 DIAGNOSIS — Z79899 Other long term (current) drug therapy: Secondary | ICD-10-CM | POA: Insufficient documentation

## 2017-01-16 LAB — BASIC METABOLIC PANEL
ANION GAP: 6 mmol/L (ref 4–13)
BUN/CREA RATIO: 17 (ref 6–22)
BUN: 12 mg/dL (ref 8–25)
CALCIUM: 9.6 mg/dL (ref 8.5–10.2)
CHLORIDE: 102 mmol/L (ref 96–111)
CO2 TOTAL: 30 mmol/L (ref 22–32)
CREATININE: 0.7 mg/dL (ref 0.49–1.10)
ESTIMATED GFR: >59 mL/min/1.73mˆ2 (ref >59)
GLUCOSE: 305 mg/dL — ABNORMAL HIGH (ref 65–139)
POTASSIUM: 4.1 mmol/L (ref 3.5–5.1)
SODIUM: 138 mmol/L (ref 136–145)

## 2017-01-16 LAB — ALBUMIN: ALBUMIN: 4 g/dL (ref 3.4–4.8)

## 2017-01-16 LAB — PARATHYROID HORMONE (PTH): PTH: 57.2 pg/mL (ref 8.5–75.0)

## 2017-01-16 MED ORDER — INSULIN GLARGINE (U-100) 100 UNIT/ML (3 ML) SUBCUTANEOUS PEN
PEN_INJECTOR | SUBCUTANEOUS | 5 refills | Status: DC
Start: 2017-01-16 — End: 2017-03-11

## 2017-01-16 MED ORDER — PEN NEEDLE, DIABETIC 32 GAUGE X 5/32"
3 refills | Status: DC
Start: 2017-01-16 — End: 2019-09-09

## 2017-01-16 NOTE — Progress Notes (Signed)
[place] NOTE    DEMOGRAPHICS    Name: Isabella Terrell, 61 y.o., female  MRN: [id number]  DOB: June 02, 1955  Date of Service: 01/16/2017    Marital Status: Married    Occupation: Pharmacist, hospital    Education Level: Conservator, museum/gallery +     Healthcare literacy:  If you need to go to the doctor, clinic or hospital, how confident are you in filling out the medical forms by yourself?  Quite confident    How often do you have someone else (family member or staff at the clinic or hospital) help you to read health or medical forms?  Never    How often do you have problems learning about your health because of trouble understanding written health information:  Never    How often do you have trouble understanding what you doctor, nurse or pharmacist (druggist) tells you about your health or about treatments?  Never    How often do you have trouble remembering instructions from the doctor, nurse or pharmacist (druggist) after you get home?  Never    What would help you best understand and remember the information you are getting about your health?  Combination of reading, listening, observing and doing    Barriers to Learning:  Visual    Healthcare Numeracy: 3/3    Primary Care Physician: Juan Quam., MD    Race: White      HISTORY    Type of diabetes: Type 1 DM    Past Medical History:   Diagnosis Date   . Diabetic retinopathy (CMS [place])    . HLD (hyperlipidemia)    . HTN (hypertension)    . Type I diabetes mellitus (CMS [facility])      Current Outpatient Prescriptions   Medication Sig   . atorvastatin (LIPITOR) 10 mg Oral Tablet Take 20 mg by mouth Once a day    . Blood Sugar Diagnostic (ONETOUCH VERIO) Strip To check sugars 4 times daily as directed   . flash glucose scanning reader (FREESTYLE LIBRE 10 DAY READER) Does not apply Misc To check sugars 4 times daily as directed   . flash glucose sensor (FREESTYLE LIBRE 10 DAY SENSOR) Does not apply Kit To check sugars 4 times daily as directed   . glucagon (GLUCAGEN) 1  mg/mL Injection Recon Soln 1 mg by Intramuscular route Once, as needed for Other (Blood sugar less than 70 and cannot eat) for up to 1 dose   . insulin glargine (LANTUS SOLOSTAR U-100 INSULIN) 100 unit/mL Subcutaneous Insulin Pen Take up to 70 units nightly as instructed   . insulin lispro (HUMALOG KWIKPEN INSULIN) 100 unit/mL Subcutaneous Insulin Pen 5 Units by Subcutaneous route Three times daily before meals   . Insulin Needles, Disposable, (PEN NEEDLE) 32 gauge x 5/32" Needle To inject 4 times per day   . lancets (ONE TOUCH DELICA) 33 gauge Misc To check sugars 4 times daily as directed   . lisinopril (PRINIVIL) 10 mg Oral Tablet Take 10 mg by mouth Once a day   . [DISCONTINUED] insulin glargine (LANTUS SOLOSTAR U-100 INSULIN) 100 unit/mL Subcutaneous Insulin Pen 24 Units by Subcutaneous route Every night     ** Medications highlighted above were reviewed during visit.     History   Alcohol Use No     History   Smoking Status   . Never Smoker   Smokeless Tobacco   . Never Used       VISIT NOTES    Reason  for Visit: Session 1 of RD/MNT for diabetes. self-management skills    Diabetes Needs Assessment for Education: Disease process, Nutritional management, Physical Activity, Medications, Monitoring, Behavior change strategies, Risk reduction strategies, Psychosocial strategies    Session Type: Individual      Vital Signs:    BP (Non-Invasive): 132/82    Weight: 69.9 kg (154 lb)  Height: 154.9 cm (5' 1" )  Body mass index is 29.1 kg/(m^2).    Per Isabella Terrell, last A1c obtained in August of this year was 12.6% which was reviewed with her during visit. Discussed the ADA target goal.       PATIENT REPORTS:      When were you diagnosed with diabetes?  About 31 years ago    Do you test your blood sugar?  Yes   Isabella Terrell reports that she recently received her Freestyle Libre fast glucose monitoring device (which patient states that she loves) and has been obtaining readings about 7 times a day on average.  Reports having a typical average of about 270 mg/dL.    Number of hypoglycemic episodes in the past 2 weeks?  None reported     Do you take your medicine?  Yes   INCREASED insulin dosages -   50 units of Lantus nightly (previously 38 units)  8 units of Humalog + sliding scale coverage before meals (previously 5 units and no sliding scale) (will be using 1 unit for every 50>150 mg/dL)    Do you follow a meal plan to manage diabetes?  No    Do you exercise?  Yes    Have you had a dilated eye exam or iris exam in the past 12 months?  Yes    Have you had your feet examined in the past 12 months?  Yes    Have you been to the dentist in the past 12 months?  Yes    Do you smoke or use tobacco?  No   If yes, do you want to quit:  N/A    Do you have questions or concerns about diabetes that you would like to talk about privately?  No    Are you ready to make changes to manage your diabetes?  Yes      MEAL PLANNING:  From review of usual eating habits and lifestyle, it appears that Isabella Terrell is eating the following:  Breakfast: eggs / coffee  Lunch: tuna and crackers / homemade soups  Dinner: salads (with chicken, nuts as a protein source)   Snack(s): nuts / Kind bars / fruit / string cheese / Skinny Pop popcorn   Beverages consumed throughout the day: water / coffee / iced tea with an "inch of sugar"    Isabella Terrell reports that she recently (within the past month) has been limiting/avoiding CHOs as much as possible because she does not want to have to take any insulin. Discussed the importance of getting a combination of the macronutrients with each meal and the need for insulin as she has recently been told she is type 1.    Based on usual eating habits and weight goals recommend 135 - 165 grams carbohydrate spread throughout the day in the following pattern:    Breakfast: 30 - 45 g  Snack: <15 g  Lunch: 30 - 45 g  Snack: <15 g  Dinner: 45 g  Snack: 15 g    Introduced and explained meal plan with emphasis on  spreading the carbohydrates grams throughout  the day. Reviewed calories, carbohydrate counting, food models, portion sizes, menu ideas, healthy snacks and food labels.      Isabella Terrell was also given a Calorie Edison Pace book for looking up total CHOs being consumed. We practiced looking up some of her favorite restaurant meals as she shared that she does often go out to eat.      GOALS:    Isabella Terrell chose the following self care goals: Eating better and Being active.      Diabetes Distress Screening Scale: A Slight Problem    Reviewed psychosocial issues and developed strategies.      KNOWLEDGE TEST:    Knowledge Pre-test: 3 out of 4    Diabetes education completed at this time.    Diabetes Self-Management Support Plan reviewed.   Individual plan: Diabetes Education, Diabetes Website    Next scheduled appointment: Follow up appointment scheduled for November 29 at 1000.         Shirlee Latch, RDLD 01/16/2017, 13:35  Total time spent with RD / Diabetes Educator: 120 minutes    The above note will be routed via EPIC to the referring provider.

## 2017-01-16 NOTE — Patient Instructions (Addendum)
You will increase your Lantus to 50 units nightly starting tonight then increase by 2 units every 2 days until morning sugars between 80 and 140 most days.  If your morning sugars are below 80, decrease your Lantus by 2 units every 2 days until morning sugars are between 80-140  For your mealtime insulin (Humalog), you will take 8 units three times a day with meals plus sliding scale as follows:  If your pre-meal blood sugar is 150: Take 0 extra units (total 8 units)  If your pre-meal blood sugar is 151-200: Take 1 extra units (total 9 units)  If your pre-meal blood sugar is 201-250: Take 2 extra units (total 10 units)  If your pre-meal blood sugar is 251-300: Take 3 extra units (total 11 units)  If your pre-meal blood sugar is 301-350: Take 4 extra units (total 12 units)  If your pre-meal blood sugar is 351-400: Take 5 extra units (total 13 units)  If your pre-meal blood sugar is more than 400: Take 6 extra units (total 14 units) and call the clinic and ask to speak to our diabetes educator, Justin MendSheree

## 2017-01-16 NOTE — Progress Notes (Signed)
[facility]  RETURN VISIT    Referral Reason:  Type I Diabetes Mellitus   Referring Provider: Juan Quam., MD  PCP: Juan Quam., MD    HPI:   Isabella Terrell is a 61 y.o. female with PMH of HTN, HLD who presents for follow up of poorly-controlled type I diabetes mellitus. Patient was diagnosed with type II diabetes mellitus about 25 years ago after presenting to her PCP with complaint of polyuria. She has never been hospitalized for high or low sugars. Patient was having multiple UTI and yeast infections so her gynecologist checked her HgA1C on 11/21/16 which was elevated to 12.6%. Last appointment, GAD and islet cell antibodies were checked to evaluate for type I DM with GAD antibodies coming back positive. She was previously on Januvia 100 mg qDaily and metformin 1000 mg BID so these were discontinued after type I diagnosis. She is now on Lantus 38 untis qPM and Humalog 8 units TID-AC. She was able to obtain a Elenor Legato and has been checking her sugars 5-7 times a day. AM and mealtime sugars are still high in the 200s-330s. Sometimes her Elenor Legato sensor falls off and she has gotten some waterproof bandages which seem to help some. She follows with Dr. Lamona Curl for her diabetes eye exams and most recent is 06/2016. Urine microalbumin/Cr ratio was good at 22.6 last appointment. She has not had any lows recently. She used to have lows in the past but no longer has any. In the past, she would get spaced out and feel like she couldn't think clearly when her sugars were in the 70s. Patient will be going to [place] for January-March.       DIABETIC COMPLICATIONS:  Microvascular Complications:    Peripheral/Autonomic Neuropathy: +Mild peripheral neuropathy, not currently on medication   Renal: Last GFR >59 12/17/16, Microalb/crt ratio 22.6 12/17/16. Currently on lisinopril 10 mg qDaily   Retinopathy: Last eye exam 06/2016. Has diabetic retinopathy requiring eye injections. Follows with Dr.  Lamona Curl in [place], [place]  Macrovascular Complications:   Hx of CAD: No   Hx of statin/ASA: On Lipitor, no aspirin; SGLT2 or GLP-1 therapy? No   Hx of Stroke: No   Hx of HTN: Yes   Hx of HLD: Yes     CURRENT ANTI-DIABETIC REGIMEN:  Patient is adherent to meds   Lantus 38 units qPM   Humalog 8 units TID-AC     PREVIOUS ANTI-DIABETIC MEDS:   Januvia- D/Ced when diagnosed with type I DM   Metformin- D/Ced when diagnosed with type I DM     BLOOD GLUCOSE TRENDS: Testing BG 5-7 times per day   Time Readings   Before Breakfast 210-220s   Before Lunch 260s   Before Supper 270-290s   Bedtime  280-310s      HYPOGLYCEMIA EVALUATION:  Is it occurring? Symptoms? Unsure but doesn't think so; symptoms in past >15 years ago were feeling woozy and eyes glazing over   Hypoglycemic Event: (Date/Time) 15 years ago   Treatment: Food, juice   Assisted or Unassisted: Unassisted   Hypoglycemic unawareness? No; feels weird when sugars in 70s      NUTRITION:  Last Visit to Diabetic Education: Many years ago; appointment scheduled for today   Diet:  Has been trying to eat better with more vegetables   Is patient drinking sugar sweetened beverages? Not much      WEIGHT:  Baseline adult weight     Current weight  Body  mass index is 29.28 kg/(m^2).        REVIEW OF SYSTEMS:  In addition to HPI...  Constitutional: negative  Eyes: negative  Ears, nose, mouth, throat, and face: negative  Respiratory: negative  Cardiovascular: negative  Gastrointestinal: negative  Genitourinary:negative  Integument/breast: negative  Hematologic/lymphatic: negative  Musculoskeletal:negative  Neurological: negative  Behavioral/Psych: negative  Endocrine: negative  Allergic/Immunologic: negative  All other ROS Negative    Past Medical History:   Diagnosis Date   . Diabetic retinopathy (CMS [place])    . HLD (hyperlipidemia)    . HTN (hypertension)    . Type I diabetes mellitus (CMS [facility])      Past Surgical History:   Procedure Laterality Date   . ENDOMETRIAL ABLATION      . HX WISDOM TEETH EXTRACTION     . TRIGGER FINGER RELEASE       Social History     Social History   . Marital status: Married     Spouse name: N/A   . Number of children: N/A   . Years of education: N/A     Occupational History   . Not on file.     Social History Main Topics   . Smoking status: Never Smoker   . Smokeless tobacco: Never Used   . Alcohol use No   . Drug use: No   . Sexual activity: Not on file     Other Topics Concern   . Not on file     Social History Narrative     Family Medical History:     Problem Relation (Age of Onset)    Diabetes type II Father, Brother, Paternal Uncle, Sister    Hypothyroidism Mother, Sister    Multiple Sclerosis Brother        Current Outpatient Prescriptions   Medication Sig   . atorvastatin (LIPITOR) 10 mg Oral Tablet Take 20 mg by mouth Once a day    . Blood Sugar Diagnostic (ONETOUCH VERIO) Strip To check sugars 4 times daily as directed   . flash glucose scanning reader (FREESTYLE LIBRE 10 DAY READER) Does not apply Misc To check sugars 4 times daily as directed   . flash glucose sensor (FREESTYLE LIBRE 10 DAY SENSOR) Does not apply Kit To check sugars 4 times daily as directed   . glucagon (GLUCAGEN) 1 mg/mL Injection Recon Soln 1 mg by Intramuscular route Once, as needed for Other (Blood sugar less than 70 and cannot eat) for up to 1 dose   . insulin glargine (LANTUS SOLOSTAR U-100 INSULIN) 100 unit/mL Subcutaneous Insulin Pen Take up to 70 units nightly as instructed   . insulin lispro (HUMALOG KWIKPEN INSULIN) 100 unit/mL Subcutaneous Insulin Pen 5 Units by Subcutaneous route Three times daily before meals   . Insulin Needles, Disposable, (PEN NEEDLE) 32 gauge x 5/32" Needle To inject 4 times per day   . lancets (ONE TOUCH DELICA) 33 gauge Misc To check sugars 4 times daily as directed   . lisinopril (PRINIVIL) 10 mg Oral Tablet Take 10 mg by mouth Once a day   . [DISCONTINUED] insulin glargine (LANTUS SOLOSTAR U-100 INSULIN) 100 unit/mL Subcutaneous Insulin Pen  24 Units by Subcutaneous route Every night     No Known Allergies    PHYSICAL EXAM:   BP 132/82  Pulse 84  Ht 1.549 m (_0 )  Wt 70.3 kg (154 lb 15.7 oz)  BMI 29.28 kg/m2   General: Well appearing female in no acute distress.  Psychiatric: Normal  mood and affect  Neuro: Alert and oriented x 3. Speech fluent. Cranial nerves II-XII intact. No focal deficits noted. Sensation intact throughout.   HEENT: Head normocephalic, atraumatic. PERRLA, EOMI, conjunctiva clear. Oral mucosa pink and moist.   Neck: No lymphadenopathy or thyromegaly. Palpable right thyroid nodule  Lungs: Clear to auscultation bilaterally. Normal respiratory effort.   Cardiac: Regular rate and rhythm, normal S1 and S2, without murmurs, rubs, or gallops.  Abdomen: Soft, non-tender, non-distended.   Extremities: No edema, erythema, warmth, or tenderness.  Skin: Warm and dry.    LABS:  Lab Results   Component Value Date/Time    SODIUM 137 12/17/2016 02:28 PM    POTASSIUM 4.0 12/17/2016 02:28 PM    CHLORIDE 100 12/17/2016 02:28 PM    CO2 26 12/17/2016 02:28 PM    BUN 13 12/17/2016 02:28 PM    CREATININE 0.81 12/17/2016 02:28 PM    MICALBCRERAT 22.6 12/17/2016 02:28 PM     12/24/2016 1:28 PM - Edi, Reference Lab Results In      Component Results     Component Value Ref Range & Units Status    GAD65 AB ASSAY 0.12 (H) <=0.02 nmol/L Final     12/24/2016 1:30 PM - [facility], Reference Lab Results In      Component Results     Component Value Ref Range & Units Status    ISLET ANTIGEN 2 (IA-2) ANTIBODY, SERUM 0.00 <=0.02 nmol/L Final       ASSESSMENT:  Isabella Terrell is a 61 y.o. female with PMH of HTN, HLD who presents for follow up of poorly-controlled type I diabetes mellitus.     PLAN:  Poorly-Controlled Type I Diabetes Mellitus  -Complicated by peripheral neuropathy, past microalbuminuria (now good), diabetic retinopathy and macrovascular diseases including HTN, HLD  -Anti-GAD and anti-islet cell antibodies to evaluate for type 1 DM checked and  positive for GAD antibody  -Most recent HgA1C 12.6% in 10/2016. Urine microalbumin/creatinine ratio 22.6; on lisinopril 10 mg qDaily  -Most recent diabetes foot exam on 12/17/2016  -Most recent diabetic eye exam 06/2016. Has diabetic retinopathy requiring eye injections. Follows with Dr. Lamona Curl in [place], [place]  -Hypoglycemia not occurring. Has glucagon injection  -D/Ced Januvia and Metformin as patient has type 1 DM  -Will increase Lantus to 50 units qPM and instructed patient to check sugars every morning and increase her Lantus by 2 units every 2 days until blood sugars 80-140 most days. If sugars are low, decrease by 2 units every night until sugars 80-140 most days  -Continue Humalog 8 units TID-AC plus sliding scale 1 unit for every 50 >150  -Has glucometer, test strips. Sent refill for pen needles  -Will check BMP to evaluate kidney function and electrolytes. Most recent BMP 11/2016 with creatinine 0.81 and GFR >59  -Advised patient to continue checking sugars 3-4x/day via FreeStyle Libre and to bring sugar logs and glucometer to clinic appointments      Return to diabetic education, appointment scheduled for today (01/16/17)   Hgba1c Goal <7%   Preprandial Glucose Target 80-140 mg/dL   [facility] Postprandial Glucose Target <180 mg/dL    BP Goal: <130/80    Labs today: BMP, albumin, PTH   Vaccines:   Flu vaccine annually- discuss with PCP   Hepatitis B- discuss with PCP    Hepatitis B, 3-dose series to unvaccinated adults with DM>60 y/o    Hepatitis B, 3-dose series to unvaccinated adults with DM 70-59 y/o    Pneumonia- discuss with  PCP    All patients with DM 2-64 y/o with pneumococcal polysaccharide vaccine (PPSV23)   At age 54 years, administer the pneumococcal conjugate vaccine (PCV 13) at least 1 year after vaccination with PPSV23, followed by another dose of vaccine PPSV23 at least 1 year after the last dose of PPSV23    Yearly eye and daily foot exams discussed   Symptoms of potential  hypoglycemia discussed and treatment of hypoglycemia discussed   Return visit in 6 weeks or earlier if needed    The patient will be contacted once any lab work is resulted    CC: Notes to referring physician      Palpable Right Thyroid Nodule  -TSH WNL. Thyroid ultrasound ordered to be done at next clinic appointment    Mild Hypercalcemia  -Noted on BMP from 12/17/16. Possibly secondary to dehydration as sugars were in the 460s at that time  -Will recheck BMP with albumin and intact PTH today     Return to diabetic education    Hgba1c Goal <7%   Preprandial Glucose Target 80-130 mg/dL   Peak Postprandial Glucose Target <180 mg/dL    BP Goal:<130/80    Labs today:    Vaccines:   Flu vaccine annually- discuss with PCP   Hepatitis B- discuss with PCP    Hepatitis B, 3-dose series to unvaccinated adults with DM>60 y/o    Hepatitis B, 3-dose series to unvaccinated adults with DM 105-59 y/o    Pneumonia- discuss with PCP    All patients with DM 2-64 y/o with pneumococcal polysaccharide vaccine (PPSV23)   At age 22 years, administer the pneumococcal conjugate vaccine (PCV 13) at least 1 year after vaccination with PPSV23, followed by another dose of vaccine PPSV23 at least 1 year after the last dose of PPSV23    Yearly eye exams dicussed.  I have also informed the patient of the need for frequent eye exams to prevent or treat future vision threatening disease in a timely manner as this will have an impact on the visual outcome and prognosis.   Daily foot exams with protective shoes discussed    Symptoms of potential hypoglycemia discussed and treatment of hypoglycemia discussed: carry glucose tablets, juice box, or other 15 g of carbs at all times    Return visit in 3 months or earlier if needed    The patient will be contacted once any lab work is resulted    CC: Notes to referring physician     Orders Placed This Encounter   . CANCELED: [facility] Endocrine Thyroid US   . [facility] Endocrine Thyroid US    . CANCELED: [facility] THYROID US   . [facility]INE THYROID US   . Basic Metabolic Panel   . Albumin   . Pth Intact   . insulin glargine (LANTUS SOLOSTAR U-100 INSULIN) 100 unit/mL Subcutaneous Insulin Pen   . Insulin Needles, Disposable, (PEN NEEDLE) 32 gauge x 5/32" Needle        Keith Rake, MD, MPH 01/16/2017, 09:01       I have seen this patient and reviewed the chart and examined the patient and agree with the above note. Shela Commons, MD 01/16/2017, 10:31

## 2017-01-17 ENCOUNTER — Telehealth (HOSPITAL_BASED_OUTPATIENT_CLINIC_OR_DEPARTMENT_OTHER): Payer: Self-pay | Admitting: "Endocrinology

## 2017-01-17 NOTE — Telephone Encounter (Signed)
Called pt, no answer, left message.Dallie DadVanessa Mekhia Brogan, MA  01/17/2017, 11:25

## 2017-01-17 NOTE — Telephone Encounter (Signed)
-----   Message from Zadie RhineNadia T [name], MD sent at 01/17/2017  8:07 AM EDT -----  Please call patient and inform her that her labs all looked good. Calcium and other electrolytes are normal. Kidney function is good. Sugars are still high but she will need to follow the instructions given to her yesterday about increasing her insulin doses.     Gordy LevanNadia [name], MD, MPH 01/17/2017, 08:07

## 2017-01-21 NOTE — Telephone Encounter (Signed)
Message from Sander Nephewobin Mari sent at 01/21/2017 9:32 AM EDT      Summary: missed call     [name], Costella HatcherNadia T, MD    The pt states she is returning a call to our office, she will be available for the next two hours at the mobile number               Spoke with pt and advised. She states she is still having the issue with the Humalog pens jamming. She did open new box and only one pen did not jam. She did take the boxes back to the pharmacy, however, pharmacy would not take them. Called pharmacy who states they are no longer allowed to call the company for replacement box for patients. He advised pt call and have replacement box shipped to them for pick up. She is due for refill now which they will prepare. Left detailed vm for pt advising her to call number on box for replacement to be sent to pharmacy and she can pick up regular refill today.  Ernst BreachRebecca Annabella Elford, RN 01/21/2017 11:00

## 2017-01-21 NOTE — Telephone Encounter (Signed)
Called patient.  No answer.  Left message to return my call.  Emogene MorganLisa Willena Jeancharles, MA  01/21/2017, 09:00

## 2017-02-10 ENCOUNTER — Other Ambulatory Visit (HOSPITAL_BASED_OUTPATIENT_CLINIC_OR_DEPARTMENT_OTHER): Payer: Self-pay | Admitting: "Endocrinology

## 2017-02-10 DIAGNOSIS — E119 Type 2 diabetes mellitus without complications: Secondary | ICD-10-CM

## 2017-02-10 MED ORDER — FLASH GLUCOSE SCANNING READER
0 refills | Status: DC
Start: 2017-02-10 — End: 2017-05-13

## 2017-02-10 NOTE — Telephone Encounter (Signed)
Script routed to provider for new reader.  Ernst BreachRebecca Karis Rilling, RN 02/10/2017 11:12

## 2017-02-10 NOTE — Telephone Encounter (Signed)
Regarding: New meter request  ----- Message from Bobby RumpfMadison R Killmeyer sent at 02/10/2017 10:52 AM EST -----  Zadie RhineBarghouthi, Nadia T, MD    Pt called stating that she cannot find her scanner. She believes she lost it while in [place]. She would like to request a new one if possible. Please call pt to advise.    flash glucose scanning reader (FREESTYLE LIBRE 10 DAY READER) Does not apply Misc 1 Each 0 12/17/2016      Sig: To check sugars 4 times daily as directed    Class: E-Rx    Notes to Pharmacy: Dx Code E11.9     Preferred Pharmacy     [id number] [place], [place] - [address]    [address] [place] [id number]    Phone: [phone] Fax: [phone]    Not a 24 hour pharmacy; exact hours not known

## 2017-02-20 ENCOUNTER — Ambulatory Visit (INDEPENDENT_AMBULATORY_CARE_PROVIDER_SITE_OTHER): Payer: Self-pay

## 2017-03-04 ENCOUNTER — Encounter (HOSPITAL_BASED_OUTPATIENT_CLINIC_OR_DEPARTMENT_OTHER): Payer: Self-pay | Admitting: "Endocrinology

## 2017-03-06 ENCOUNTER — Ambulatory Visit (HOSPITAL_BASED_OUTPATIENT_CLINIC_OR_DEPARTMENT_OTHER): Payer: No Typology Code available for payment source

## 2017-03-06 ENCOUNTER — Encounter (INDEPENDENT_AMBULATORY_CARE_PROVIDER_SITE_OTHER): Payer: Self-pay

## 2017-03-06 ENCOUNTER — Ambulatory Visit
Payer: No Typology Code available for payment source | Attending: INTERNAL MEDICINE-ENDOCRINOLOGY-DIABETES AND METABOLISM

## 2017-03-06 ENCOUNTER — Encounter (HOSPITAL_BASED_OUTPATIENT_CLINIC_OR_DEPARTMENT_OTHER): Payer: Self-pay

## 2017-03-06 DIAGNOSIS — E041 Nontoxic single thyroid nodule: Secondary | ICD-10-CM | POA: Insufficient documentation

## 2017-03-06 NOTE — Procedures (Unsigned)
[]  Hide copied text    [] Hover for details      Name:  Isabella PalmsBarbara Terrell  D Number: [id number]  Indication: History of Thyroid nodule  Procedure: Physician-performed, real time ultrasound limited to the thyroid gland and the anterior neck including nodes (Level I, II, III, IV, V, VI), longitudinal and transverse images were obtained of both lobes and isthmus.   All thyroid measurements in centimeters in the  longitudinal x AP X transverse planes.   Right thyroid gland measures 3.2 x 1.17 x 1.26  Left thyroid gland measures 4.04 x 1.26 x 1.36   Isthmus is 0.26 cm in greatest dimension.   Overall the gland is symmetrical homogeneous echotexture.   I do not see any nodule of either lobe.   Doppler blood flow is Grade 1.   Impression:   1. Symmetrical  thyroid gland with   Homogenous  echotexture.  2. No nodularity noted on sonographic images  3. No significant cervical lymphadenopathy is seen.   4. No previous comparison study is available.     Recommendations:     No thyroid nodules noted on Ultrasound today.   Homogenous echo texture .       Ardelle ParkADNAN Lalitha Ilyas, MD   Assistant Professor    Endocrinology and [facility]Metabolism  [facility]  [phone]

## 2017-03-06 NOTE — Procedures (Unsigned)
Name:  Marquita PalmsBarbara Canada  D Number: [id number]  Indication: History of Thyroid nodule  Procedure: Physician-performed, real time ultrasound limited to the thyroid gland and the anterior neck including nodes (Level I, II, III, IV, V, VI), longitudinal and transverse images were obtained of both lobes and isthmus.   All thyroid measurements in centimeters in the  longitudinal x AP X transverse planes.   Right thyroid gland measures 3.2 x 1.17 x 1.26  Left thyroid gland measures 4.04 x 1.26 x 1.36   Isthmus is 0.26 cm  Isthmus is          in greatest dimension.   Overall the gland is symmetrical homogeneous echotexture.   I do not see any nodule of either lobe.   Doppler blood flow is Grade 1.   Impression:   1. Symmetrical  thyroid gland with   Homogenous  echotexture.  2. No nodularity noted on sonographic images  3. No significant cervical lymphadenopathy is seen.   4. No previous comparison study is available.     Recommendations:     No thyroid nodules noted on Ultrasound today.   Homogenous echo texture .       Ardelle ParkADNAN Kiowa Hollar, MD   Assistant Professor    Endocrinology and [facility]  [facility]  [phone]

## 2017-03-10 NOTE — Progress Notes (Signed)
[facility]  RETURN VISIT    Referral Reason:  Type I Diabetes Mellitus   Referring Provider: Juan Quam., MD  PCP: Juan Quam., MD    HPI:   Isabella Terrell is a 61 y.o. female with PMH of HTN, HLD who presents for follow up of poorly-controlled type I diabetes mellitus. Patient was diagnosed with type II diabetes mellitus about 25 years ago after presenting to her PCP with complaint of polyuria. She has never been hospitalized for high or low sugars. Patient was having multiple UTI and yeast infections so her gynecologist checked her HgA1C on 11/21/16 which was elevated to 12.6%. When she came to establish here, GAD and islet cell antibodies were checked to evaluate for type I DM with GAD antibodies coming back positive. She was previously on Januvia 100 mg qDaily and metformin 1000 mg BID so these were discontinued after type I diagnosis. She is now on Lantus 72 units qPM and Humalog 12-16 units TID-AC. She was able to obtain a Elenor Legato however she lost the scanner in [place]. Insurance paid for a new one however the sensors were falling off so she has not been wearing the [place] and not checking her sugars often. She did not bring sugar logs today. She was able to obtain SimplePatch patches to help keep the sensors in place and is now wearing the [place] again. AM sugars are about 200s but she does not know what her other sugars are running. She follows with Dr. Lamona Curl for her diabetes eye exams and most recent is 02/2017 where she got eye injections and had her pressures checked. Urine microalbumin/Cr ratio was good at 22.6. Hypoglycemia is not occurring.  In the past, she would get spaced out and feel like she couldn't think clearly when her sugars were in the 70s. She has a glucagon kit and has not had to use it. Patient will be going to [place] for January-March. She carries candy with her in case of lows. She has gained about 10 pounds since starting insulin and was  thinking about not taking her insulin but was worried about detrimental health effects of that. She denies any numbness/tingling or wounds in her feet. She states her diet has not been good lately and that she has been eating more sweets. She injects her insulin in her abdomen.     DIABETIC COMPLICATIONS:  Microvascular Complications:    Peripheral/Autonomic Neuropathy: +Mild peripheral neuropathy, not currently on medication   Renal: Last GFR >59 01/16/17, Microalb/crt ratio 22.6 12/17/16. Currently on lisinopril 10 mg qDaily   Retinopathy: Last eye exam 02/2017. Has diabetic retinopathy requiring eye injections. Follows with Dr. Lamona Curl in [place], [place]  Macrovascular Complications:   Hx of CAD: No   Hx of statin/ASA: On Lipitor, no aspirin; SGLT2 or GLP-1 therapy? No   Hx of Stroke: No   Hx of HTN: Yes   Hx of HLD: Yes     CURRENT ANTI-DIABETIC REGIMEN:  Patient is adherent to meds   Lantus 72 units qPM   Humalog 12 units TID-AC plus sliding scale 1 unit for every 50 >150     PREVIOUS ANTI-DIABETIC MEDS:   Januvia- D/Ced when diagnosed with type I DM   Metformin- D/Ced when diagnosed with type I DM     BLOOD GLUCOSE TRENDS: Testing BG 0-1 times per day   Time Readings   Before Breakfast 200s   Before Lunch None   Before Supper None  Bedtime  None      HYPOGLYCEMIA EVALUATION:  Is it occurring? Symptoms? None recently; symptoms in past >15 years ago were feeling woozy and eyes glazing over   Hypoglycemic Event: (Date/Time) 15 years ago   Treatment: Food, juice   Assisted or Unassisted: Unassisted   Hypoglycemic unawareness? No; feels weird when sugars in 70s      NUTRITION:  Last Visit to Diabetic Education: 12/2016   Diet:  Has been eating more sweets lately   Is patient drinking sugar sweetened beverages? Not much      WEIGHT:  Baseline adult weight     Current weight  Body mass index is 31.13 kg/m.        REVIEW OF SYSTEMS:  In addition to HPI...  Constitutional: negative  Eyes: negative  Ears,  nose, mouth, throat, and face: negative  Respiratory: negative  Cardiovascular: negative  Gastrointestinal: negative  Genitourinary:negative  Integument/breast: negative  Hematologic/lymphatic: negative  Musculoskeletal:negative  Neurological: negative  Behavioral/Psych: negative  Endocrine: negative  Allergic/Immunologic: negative  All other ROS Negative    Past Medical History:   Diagnosis Date   . Diabetic retinopathy (CMS [facility])    . HLD (hyperlipidemia)    . HTN (hypertension)    . Type I diabetes mellitus (CMS [facility])      Past Surgical History:   Procedure Laterality Date   . ENDOMETRIAL ABLATION     . HX WISDOM TEETH EXTRACTION     . TRIGGER FINGER RELEASE       Social History     Socioeconomic History   . Marital status: Married     Spouse name: Not on file   . Number of children: Not on file   . Years of education: Not on file   . Highest education level: Not on file   Social Needs   . Financial resource strain: Not on file   . Food insecurity - worry: Not on file   . Food insecurity - inability: Not on file   . Transportation needs - medical: Not on file   . Transportation needs - non-medical: Not on file   Occupational History   . Not on file   Tobacco Use   . Smoking status: Never Smoker   . Smokeless tobacco: Never Used   Substance and Sexual Activity   . Alcohol use: No   . Drug use: No   . Sexual activity: Not on file   Other Topics Concern   . Not on file   Social History Narrative   . Not on file     Family Medical History:     Problem Relation (Age of Onset)    Diabetes type II Father, Brother, Paternal Uncle, Sister    Hypothyroidism Mother, Sister    Multiple Sclerosis Brother        Current Outpatient Medications   Medication Sig   . atorvastatin (LIPITOR) 20 mg Oral Tablet Take 1 Tab (20 mg total) by mouth Once a day   . Blood Sugar Diagnostic (ONETOUCH VERIO) Strip To check sugars 4 times daily as directed   . flash glucose scanning reader (FREESTYLE LIBRE 10 DAY READER) Does not apply Misc To  check sugars 4 times daily as directed   . flash glucose sensor (FREESTYLE LIBRE 10 DAY SENSOR) Does not apply Kit To check sugars 4 times daily as directed   . glucagon (GLUCAGEN) 1 mg/mL Injection Recon Soln 1 mg by Intramuscular route Once, as needed for Other (Blood  sugar less than 70 and cannot eat) for up to 1 dose   . insulin glargine (LANTUS SOLOSTAR U-100 INSULIN) 100 unit/mL Subcutaneous Insulin Pen Take up to 90 units nightly as instructed   . insulin lispro (HUMALOG KWIKPEN INSULIN) 100 unit/mL Subcutaneous Insulin Pen 14 Units by Subcutaneous route Three times daily before meals   . Insulin Needles, Disposable, (PEN NEEDLE) 32 gauge x 5/32" Needle To inject 4 times per day   . lancets (ONE TOUCH DELICA) 33 gauge Misc To check sugars 4 times daily as directed   . lisinopril (PRINIVIL) 10 mg Oral Tablet Take 10 mg by mouth Once a day   . semaglutide (OZEMPIC) 0.25 mg or 0.5 mg(2 mg/1.5 mL) Subcutaneous Pen Injector 0.25 weekly x 4 weeks then 0.5 mg weekly x 4 weeks then 1 mg weekly   . semaglutide (OZEMPIC) 1 mg/0.75 mL (2 mg/1.5 mL) Subcutaneous Pen Injector 1 mg by Subcutaneous route Every 7 days     No Known Allergies    PHYSICAL EXAM:   Vitals:    03/11/17 0928   BP: 128/78   Pulse: 94   Weight: 74.7 kg (164 lb 10.9 oz)   Height: 1.549 m (5' 0.98")   BMI: 31.2   General: Well appearing female in no acute distress.  Psychiatric: Normal mood and affect  Neuro: Alert and oriented x 3. Speech fluent. Cranial nerves II-XII intact. No focal deficits noted. Sensation intact throughout.  HEENT: Head normocephalic, atraumatic. PERRLA, EOMI, conjunctiva clear. Oral mucosa pink and moist.   Thyroid/Neck: No lymphadenopathy or thyromegaly.   Lungs: Clear to auscultation bilaterally. Normal respiratory effort.   Cardiac: Regular rate and rhythm, normal S1 and S2, without murmurs, rubs, or gallops.  Abdomen: Soft, non-tender, non-distended.   Extremities: No edema, erythema, warmth, or tenderness.  Skin: Warm and  dry. No visible rashes.      LABS:  Lab Results   Component Value Date/Time    SODIUM 138 01/16/2017 10:04 AM    POTASSIUM 4.1 01/16/2017 10:04 AM    CHLORIDE 102 01/16/2017 10:04 AM    CO2 30 01/16/2017 10:04 AM    BUN 12 01/16/2017 10:04 AM    CREATININE 0.70 01/16/2017 10:04 AM    MICALBCRERAT 22.6 12/17/2016 02:28 PM     12/24/2016 1:28 PM - Edi, Reference Lab Results In      Component Results     Component Value Ref Range & Units Status    GAD65 AB ASSAY 0.12 (H) <=0.02 nmol/L Final     12/24/2016 1:30 PM - Edi, Reference Lab Results In      Component Results     Component Value Ref Range & Units Status    ISLET ANTIGEN 2 (IA-2) ANTIBODY, SERUM 0.00 <=0.02 nmol/L Final      Ref. Range 01/16/2017 10:04   INTACT PTH Latest Ref Range: 8.5 - 75.0 pg/mL 57.2   ALBUMIN Latest Ref Range: 3.4 - 4.8 g/dL 4.0     ASSESSMENT:  Isabella Terrell is a 61 y.o. female with PMH of HTN, HLD who presents for follow up of poorly-controlled type I diabetes mellitus.     PLAN:  Poorly-Controlled Type I Diabetes Mellitus  -Complicated by mild peripheral neuropathy, past microalbuminuria (now good), diabetic retinopathy and macrovascular diseases including HTN, HLD  -Anti-GAD and anti-islet cell antibodies to evaluate for type 1 DM checked and positive for GAD antibody  -Most recent HgA1C 12.6% in 10/2016, will repeat today. Urine microalbumin/creatinine ratio 22.6 in 11/2016; on  lisinopril 10 mg qDaily  -Annual diabetes foot exam done 12/17/2016  -Most recent diabetic eye exam 02/2017. Has diabetic retinopathy requiring eye injections. Follows with Dr. Lamona Curl in [place], [place]  -Hypoglycemia not occurring. Has glucagon injection and carries candy. Discussed with patient how to decrease her Lantus if sugars low: decrease by 2 units every night until sugars 80-140 most days  -Continue Lantus 72 units qPM and instructed patient to check sugars every morning and increase her Lantus by 2 units every 2 days until blood sugars 80-140 most  days.  -Continue Humalog 12 units TID-AC plus sliding scale 1 unit for every 50 >150  -Patient likely has element of insulin resistance as she requires high doses of insulin. Since she never went into DKA, she likely has some pancreatic beta cell function. Will start Ozempic to help with glucose control and weight loss. Will start 0.25 mg weekly x 4 weeks then 0.5 mg weekly x 4 weeks then 1 mg weekly to stay on that dose. Discussed most common side effect of nausea with patient and informed her that this usually goes away in a few weeks. Discussed black box warning including medullary thyroid cancer in rats and discussed symptoms of pancreatitis and instructed her to call here or go to the ED if she experiences severe abdominal pain associated with nausea/vomiting. She has no personal or family history of thyroid cancer or pancreatitis.   -Has glucometer, test strips, pen needles. Will refill Lantus and Humalog  -Most recent BMP 01/16/2017 with creatinine 0.70 and GFR >59. Will repeat at next appointment  -Advised patient to check sugars 3-4x/day via FreeStyle Libre and to bring sugar logs and glucometer to clinic appointments      Hgba1c Goal <7%   Preprandial Glucose Target 80-140 mg/dL   Peak Postprandial Glucose Target <180 mg/dL    BP Goal: <130/80    Labs today: HgA1C   Vaccines:   Flu vaccine annually- discuss with PCP   Hepatitis B- discuss with PCP    Hepatitis B, 3-dose series to unvaccinated adults with DM>60 y/o    Hepatitis B, 3-dose series to unvaccinated adults with DM 75-59 y/o    Pneumonia- discuss with PCP    All patients with DM 2-64 y/o with pneumococcal polysaccharide vaccine (PPSV23)   At age 26 years, administer the pneumococcal conjugate vaccine (PCV 13) at least 1 year after vaccination with PPSV23, followed by another dose of vaccine PPSV23 at least 1 year after the last dose of PPSV23    Yearly eye and daily foot exams discussed   Symptoms of potential hypoglycemia discussed  and treatment of hypoglycemia discussed   Return visit in 4 months or earlier if needed    The patient will be contacted once any lab work is resulted    CC: Notes to referring physician      Palpable Right Thyroid Nodule- Resolved  -TSH WNL. Thyroid ultrasound done 03/06/17 without thyroid nodules with homogenous thyroid texture    Mild Hypercalcemia- Resolved  -Noted on BMP from 12/17/16. Possibly secondary to dehydration as sugars were in the 460s at that time  -Repeat BMP, albumin, PTH in 12/2016 were normal    Orders Placed This Encounter   . Hemoglobin A1C   . semaglutide (OZEMPIC) 0.25 mg or 0.5 mg(2 mg/1.5 mL) Subcutaneous Pen Injector   . semaglutide (OZEMPIC) 1 mg/0.75 mL (2 mg/1.5 mL) Subcutaneous Pen Injector   . insulin glargine (LANTUS SOLOSTAR U-100 INSULIN) 100 unit/mL Subcutaneous Insulin Pen   .  insulin lispro (HUMALOG KWIKPEN INSULIN) 100 unit/mL Subcutaneous Insulin Pen   . atorvastatin (LIPITOR) 20 mg Oral Tablet     Keith Rake, MD, MPH 03/11/2017, 10:14          I saw and examined the patient.  I reviewed the fellow's note.  I agree with the findings and plan of care as documented in the fellow's note.  Any exceptions/additions are edited/noted.    Bridgett Larsson, MD

## 2017-03-11 ENCOUNTER — Ambulatory Visit
Payer: No Typology Code available for payment source | Attending: INTERNAL MEDICINE-ENDOCRINOLOGY-DIABETES AND METABOLISM | Admitting: "Endocrinology

## 2017-03-11 ENCOUNTER — Encounter (HOSPITAL_BASED_OUTPATIENT_CLINIC_OR_DEPARTMENT_OTHER): Payer: Self-pay | Admitting: "Endocrinology

## 2017-03-11 ENCOUNTER — Ambulatory Visit (HOSPITAL_BASED_OUTPATIENT_CLINIC_OR_DEPARTMENT_OTHER): Payer: No Typology Code available for payment source

## 2017-03-11 VITALS — BP 128/78 | HR 94 | Ht 60.98 in | Wt 164.7 lb

## 2017-03-11 DIAGNOSIS — E10319 Type 1 diabetes mellitus with unspecified diabetic retinopathy without macular edema: Secondary | ICD-10-CM

## 2017-03-11 DIAGNOSIS — E109 Type 1 diabetes mellitus without complications: Secondary | ICD-10-CM

## 2017-03-11 DIAGNOSIS — N289 Disorder of kidney and ureter, unspecified: Secondary | ICD-10-CM | POA: Insufficient documentation

## 2017-03-11 DIAGNOSIS — E1042 Type 1 diabetes mellitus with diabetic polyneuropathy: Secondary | ICD-10-CM | POA: Insufficient documentation

## 2017-03-11 DIAGNOSIS — I10 Essential (primary) hypertension: Secondary | ICD-10-CM

## 2017-03-11 DIAGNOSIS — Z79899 Other long term (current) drug therapy: Secondary | ICD-10-CM | POA: Insufficient documentation

## 2017-03-11 DIAGNOSIS — E1065 Type 1 diabetes mellitus with hyperglycemia: Secondary | ICD-10-CM | POA: Insufficient documentation

## 2017-03-11 DIAGNOSIS — E1040 Type 1 diabetes mellitus with diabetic neuropathy, unspecified: Secondary | ICD-10-CM

## 2017-03-11 DIAGNOSIS — E785 Hyperlipidemia, unspecified: Secondary | ICD-10-CM

## 2017-03-11 DIAGNOSIS — E1029 Type 1 diabetes mellitus with other diabetic kidney complication: Secondary | ICD-10-CM | POA: Insufficient documentation

## 2017-03-11 LAB — HGA1C (HEMOGLOBIN A1C WITH EST AVG GLUCOSE)
ESTIMATED AVERAGE GLUCOSE: 266 mg/dL
HEMOGLOBIN A1C: 10.9 % — ABNORMAL HIGH (ref 4.0–6.0)
HEMOGLOBIN A1C: 10.9 % — ABNORMAL HIGH (ref 4.0–6.0)

## 2017-03-11 MED ORDER — SEMAGLUTIDE 1 MG/DOSE (2 MG/1.5 ML) SUBCUTANEOUS PEN INJECTOR
1.0000 mg | PEN_INJECTOR | SUBCUTANEOUS | 3 refills | Status: DC
Start: 2017-03-11 — End: 2017-07-15

## 2017-03-11 MED ORDER — SEMAGLUTIDE 0.25 MG OR 0.5 MG (2 MG/1.5 ML) SUBCUTANEOUS PEN INJECTOR
PEN_INJECTOR | SUBCUTANEOUS | 0 refills | Status: DC
Start: 2017-03-11 — End: 2017-07-15

## 2017-03-11 MED ORDER — ATORVASTATIN 20 MG TABLET
20.0000 mg | ORAL_TABLET | Freq: Every day | ORAL | 1 refills | Status: DC
Start: 2017-03-11 — End: 2019-04-26

## 2017-03-11 MED ORDER — INSULIN GLARGINE (U-100) 100 UNIT/ML (3 ML) SUBCUTANEOUS PEN
PEN_INJECTOR | SUBCUTANEOUS | 5 refills | Status: DC
Start: 2017-03-11 — End: 2017-07-15

## 2017-03-11 MED ORDER — INSULIN LISPRO (U-100) 100 UNIT/ML SUBCUTANEOUS PEN
14.00 [IU] | PEN_INJECTOR | Freq: Three times a day (TID) | SUBCUTANEOUS | 3 refills | Status: DC
Start: 2017-03-11 — End: 2017-07-15

## 2017-03-11 NOTE — Patient Instructions (Signed)
We are going to start you on Ozempic. You will take 0.25 mg weekly x 4 weeks then take 0.5 mg weekly x 4 weeks then increase to 1 mg weekly and stay on that dose.

## 2017-03-13 ENCOUNTER — Telehealth (HOSPITAL_BASED_OUTPATIENT_CLINIC_OR_DEPARTMENT_OTHER): Payer: Self-pay | Admitting: "Endocrinology

## 2017-03-13 NOTE — Telephone Encounter (Signed)
Attempted to reach patient. No answer, LM to call clinic back. Ria CommentKatelyn M Keller Bounds, RN  03/13/2017, 16:16

## 2017-03-13 NOTE — Telephone Encounter (Signed)
-----   Message from Zadie RhineNadia T [name], MD sent at 03/12/2017  7:47 AM EST -----  Nurses, please let Isabella Terrell know that her hemoglobin A1C is 10.9%. It is better than what it was previously at 12.6% but still high so we need better sugar control to help prevent progression to kidney disease, worsening of her diabetic eye disease, and neuropathy in her feet in addition to preventing heart disease and stroke. She should continue Lantus 72 units qPM and keep checking every morning and keep increasing the Lantus by 2 units every 2 days until morning blood sugars are 80-140 most days. She should continue Humalog 12 units three times a day with meals plus sliding scale 1 unit for every 50 >150. I sent a prescription for Ozempic as well which will help lower sugars too but it is okay for her to start this after [place] is in [place] in a few weeks.     Gordy LevanNadia [name], MD, MPH 03/12/2017, 07:46

## 2017-03-14 NOTE — Telephone Encounter (Signed)
Called pt, relayed message below, pt stated understanding with no questions.Dallie DadVanessa Judas Mohammad, MA  03/14/2017, 09:28

## 2017-05-13 ENCOUNTER — Ambulatory Visit (HOSPITAL_BASED_OUTPATIENT_CLINIC_OR_DEPARTMENT_OTHER): Payer: Self-pay | Admitting: "Endocrinology

## 2017-05-13 ENCOUNTER — Other Ambulatory Visit (HOSPITAL_BASED_OUTPATIENT_CLINIC_OR_DEPARTMENT_OTHER): Payer: Self-pay | Admitting: "Endocrinology

## 2017-05-13 MED ORDER — FLASH GLUCOSE SENSOR KIT: Each | 3 refills | 0 days | Status: DC

## 2017-05-13 MED ORDER — FLASH GLUCOSE SCANNING READER: Each | 0 refills | 0 days | Status: AC

## 2017-05-13 NOTE — Telephone Encounter (Signed)
-----   Message from Amalia HaileyAshley Nicole Liller, RN sent at 05/13/2017  3:50 PM EST -----  ----- Message from Carleene MainsSusie Shrouts sent at 05/13/2017  2:54 PM EST -----  Zadie RhineBarghouthi, Nadia T, MD  Pt is calling stating that she is out of [place] in [place] and her blood sugars have been running 400's and she needs to speak with you about this at [phone]

## 2017-05-13 NOTE — Telephone Encounter (Signed)
Patient states that her blood sugars are over 400, advised to increase meals to 16 + ss. Isabella Terrell CulverBrooke Elizjah Noblet, RN  05/13/2017, 17:11

## 2017-06-03 LAB — ENTER/EDIT EXTERNAL COMMON LAB RESULTS
ALBUMIN (SERUM): 4.4
ALKALINE PHOSPHATASE: 59
ALT (SGPT): 23
AST (SGOT): 18
BILIRUBIN, TOTAL: 0.6
BUN: 14
CALCIUM: 9.4
CARBON DIOXIDE: 32
CHLORIDE: 100
CREATININE: 0.4
EGFR AFRICAN AMERICAN  - EAST: >60
EGFR AFRICAN AMERICAN: >60
EGFR IF NONAFRICN AM: >60 mL/min/{1.73_m2}
GLOBULIN: 2.2
GLUCOSE,NONFAST: 228 — ABNORMAL HIGH
HCT: 37.9
HEMOGLOBIN A1C: 7.8 — ABNORMAL HIGH
HGB: 12.9
PLATELET COUNT: 207
POTASSIUM: 4.2
SODIUM: 138
Total Protein: 6.6
WBC: 5.1

## 2017-07-05 ENCOUNTER — Other Ambulatory Visit (HOSPITAL_BASED_OUTPATIENT_CLINIC_OR_DEPARTMENT_OTHER): Payer: Self-pay | Admitting: "Endocrinology

## 2017-07-10 NOTE — Progress Notes (Signed)
[facility]  RETURN VISIT    Chief Complaint:  Type I Diabetes Mellitus   Referring Provider: Juan Quam., MD  PCP: Juan Quam., MD    HPI:   Isabella Terrell is a 62 y.o. female with PMH of HTN, HLD who presents for follow up of type I diabetes mellitus.     Past history:  -Diagnosed with type II diabetes mellitus about 25 years ago after presenting to her PCP with polyuria  -Has never been hospitalized for high or low sugars  -Was having multiple UTI and yeast infections so her gynecologist checked her HgA1C on 11/21/16 which was elevated to 12.6%  -When established here, GAD and islet cell antibodies were checked to evaluate for type I DM with GAD antibodies coming back positive  -Previously on Januvia 100 mg qDaily and metformin 1000 mg BID discontinued after type I diagnosis  -Now on Lantus 32 units qPM and Novolog 14 units TID-AC plus sliding scale 1 unit for 50 >150    She was on Lantus 22 units qPM until about a month ago when sugars started worsening but previously was on 72 units nightly. Patient's most recent HgA1C was 7.8% in 05/2017. Her urine microalbumin/Cr ratio was 22.6 in 11/2016 and she is currently taking lisinopril 10 mg qDaily. She has a [employer] and checks her sugars at least 6 times a day. AM sugars are 180-270s, before lunch 300-400s, before dinner 300-400s, before bedtime 200-290s. She had her sugars really well controlled until last month when she began developing worsening low back pain and sciatica in addition to family stressors. She states that her son has mental health diseases and seizure disorders and was recently arrested and required involuntary commitment to a psychiatric facility for adjustment of psych medications and seizure medications. She was walking more in the past but has decreased walking due to severe low back pain which is more on the right side. Her PCP did an X-ray which she states showed arthritis. She denies swelling in  her legs. She fell twice while in [place] on vacation the past few months and has had more trouble walking. She denies any bowel or bladder incontinence and has never seen a spine doctor for her symptoms. She denies any numbness/tingling or wounds in her feet. She had her last diabetes eye exam in 02/2017 and follows with Dr. Lamona Curl and had eye injections in the past for retinopathy. She was prescribed Ozempic last appointment to help with insulin resistance and weight loss however she did not start taking it as her sugars were better controlled at the time. She is interested in getting an insulin pump, particularly one she can wear while swimming.     DIABETIC COMPLICATIONS:  Microvascular Complications:    Peripheral/Autonomic Neuropathy: +Mild peripheral neuropathy, not currently on medication   Renal: Last GFR >59 05/2017, Microalb/crt ratio 22.6 on 12/17/16. Currently on lisinopril 10 mg qDaily   Retinopathy: Last eye exam 02/2017. Has diabetic retinopathy requiring eye injections. Follows with Dr. Lamona Curl in [place], [place]  Macrovascular Complications:   Hx of CAD: No   Hx of statin/ASA: On Lipitor, no aspirin; SGLT2 or GLP-1 therapy? No   Hx of Stroke: No   Hx of HTN: Yes   Hx of HLD: Yes     CURRENT ANTI-DIABETIC REGIMEN:  Patient is adherent to meds   Lantus 32 units qPM   Humalog 14 units TID-AC plus sliding scale 1 unit for every 50 >150  PREVIOUS ANTI-DIABETIC MEDS:   Januvia- D/Ced when diagnosed with type I DM   Metformin- D/Ced when diagnosed with type I DM     BLOOD GLUCOSE TRENDS: Testing BG 6 times per day   Time Readings   Before Breakfast 180-270s   Before Lunch 300-400s   Before Supper 300-400s   Bedtime  200-290s      HYPOGLYCEMIA EVALUATION:  Is it occurring? Symptoms? None recently; symptoms in past >15 years ago were feeling woozy and eyes glazing over   Hypoglycemic Event: (Date/Time) 15 years ago   Treatment: Food, juice   Assisted or Unassisted: Unassisted   Hypoglycemic  unawareness? No; feels weird when sugars in 70s      NUTRITION:  Last Visit to Diabetic Education: 12/2016   Diet:  Ate slightly worse while in [place] the past few months   Is patient drinking sugar sweetened beverages? Not much      WEIGHT:  Baseline adult weight     Current weight  Body mass index is 31.22 kg/m.        REVIEW OF SYSTEMS:  In addition to HPI...  Constitutional: negative  Eyes: negative  Ears, nose, mouth, throat, and face: negative  Respiratory: negative  Cardiovascular: negative  Gastrointestinal: negative  Genitourinary:negative  Integument/breast: negative  Hematologic/lymphatic: negative  Musculoskeletal:negative  Neurological: negative  Behavioral/Psych: negative  Endocrine: negative  Allergic/Immunologic: negative  All other ROS Negative    Past Medical History:   Diagnosis Date   . Diabetic retinopathy (CMS [place])    . HLD (hyperlipidemia)    . HTN (hypertension)    . Type I diabetes mellitus (CMS [facility])      Past Surgical History:   Procedure Laterality Date   . ENDOMETRIAL ABLATION     . HX WISDOM TEETH EXTRACTION     . TRIGGER FINGER RELEASE       Social History     Socioeconomic History   . Marital status: Married     Spouse name: Not on file   . Number of children: Not on file   . Years of education: Not on file   . Highest education level: Not on file   Occupational History   . Not on file   Social Needs   . Financial resource strain: Not on file   . Food insecurity:     Worry: Not on file     Inability: Not on file   . Transportation needs:     Medical: Not on file     Non-medical: Not on file   Tobacco Use   . Smoking status: Never Smoker   . Smokeless tobacco: Never Used   Substance and Sexual Activity   . Alcohol use: No   . Drug use: No   . Sexual activity: Not on file   Lifestyle   . Physical activity:     Days per week: Not on file     Minutes per session: Not on file   . Stress: Not on file   Relationships   . Social connections:     Talks on phone: Not on file     Gets together:  Not on file     Attends religious service: Not on file     Active member of club or organization: Not on file     Attends meetings of clubs or organizations: Not on file     Relationship status: Not on file   . Intimate partner violence:  Fear of current or ex partner: Not on file     Emotionally abused: Not on file     Physically abused: Not on file     Forced sexual activity: Not on file   Other Topics Concern   . Not on file   Social History Narrative   . Not on file     Family Medical History:     Problem Relation (Age of Onset)    Diabetes type II Father, Brother, Paternal Uncle, Sister    Hypothyroidism Mother, Sister    Multiple Sclerosis Brother        Current Outpatient Medications   Medication Sig   . atorvastatin (LIPITOR) 20 mg Oral Tablet Take 1 Tab (20 mg total) by mouth Once a day   . Blood Sugar Diagnostic (ONETOUCH VERIO) Strip To check sugars 4 times daily as directed   . flash glucose scanning reader (FREESTYLE LIBRE 14 DAY READER) Does not apply Misc To test blood sugars E10.9   . flash glucose sensor (FREESTYLE LIBRE 14 DAY SENSOR) Does not apply Kit To test blood sugars E10.9   . glucagon (GLUCAGEN) 1 mg/mL Injection Recon Soln 1 mg by Intramuscular route Once, as needed for Other (Blood sugar less than 70 and cannot eat) for up to 1 dose   . insulin glargine (LANTUS SOLOSTAR U-100 INSULIN) 100 unit/mL Subcutaneous Insulin Pen Take up to 90 units nightly as instructed   . insulin lispro (HUMALOG KWIKPEN INSULIN) 100 unit/mL Subcutaneous Insulin Pen Take up to 30 units three times a day as instructed   . Insulin Needles, Disposable, (PEN NEEDLE) 32 gauge x 5/32" Needle To inject 4 times per day   . lancets (ONE TOUCH DELICA) 33 gauge Misc To check sugars 4 times daily as directed   . lisinopril (PRINIVIL) 10 mg Oral Tablet Take 10 mg by mouth Once a day   . semaglutide (OZEMPIC) 0.25 mg or 0.5 mg(2 mg/1.5 mL) Subcutaneous Pen Injector 0.25 weekly x 4 weeks then 0.5 mg weekly x 4 weeks then 1  mg weekly   . semaglutide (OZEMPIC) 1 mg/dose (2 mg/1.5 mL) Subcutaneous Pen Injector 1 mg by Subcutaneous route Every 7 days     No Known Allergies    PHYSICAL EXAM:   Vitals:    07/15/17 1252   BP: 134/82   Pulse: 99   Weight: 74.9 kg (165 lb 2 oz)   Height: 1.549 m (5' 0.98")   BMI: 31.28   General: Well appearing female in no acute distress.  Psychiatric: Normal mood and affect  Neuro: Alert and oriented x 3. Speech fluent. Cranial nerves II-XII intact. No focal deficits noted. Sensation intact throughout.  HEENT: Head normocephalic, atraumatic. PERRLA, EOMI, conjunctiva clear. Oral mucosa pink and moist.   Thyroid/Neck: No lymphadenopathy or thyromegaly.   Lungs: Clear to auscultation bilaterally. Normal respiratory effort.   Cardiac: Regular rate and rhythm, normal S1 and S2, without murmurs, rubs, or gallops.  Abdomen: Soft, non-tender, non-distended. Few bruises on abdomen from injections.   Extremities: No edema, erythema, warmth, or tenderness.  Skin: Warm and dry. No visible rashes.      LABS:  Lab Results   Component Value Date/Time    HA1C 10.9 (H) 03/11/2017 10:20 AM    SODIUM 138 01/16/2017 10:04 AM    POTASSIUM 4.1 01/16/2017 10:04 AM    CHLORIDE 102 01/16/2017 10:04 AM    CO2 30 01/16/2017 10:04 AM    BUN 12 01/16/2017 10:04 AM  CREATININE 0.70 01/16/2017 10:04 AM    MICALBCRERAT 22.6 12/17/2016 02:28 PM     -Outside labs 05/2017: HgA1C 7.8%, creatinine 0.4, GFR >60, normal BMP    12/24/2016 1:28 PM - Edi, Reference Lab Results In      Component Results     Component Value Ref Range & Units Status    GAD65 AB ASSAY 0.12 (H) <=0.02 nmol/L Final     12/24/2016 1:30 PM - Edi, Reference Lab Results In      Component Results     Component Value Ref Range & Units Status    ISLET ANTIGEN 2 (IA-2) ANTIBODY, SERUM 0.00 <=0.02 nmol/L Final      Ref. Range 01/16/2017 10:04   INTACT PTH Latest Ref Range: 8.5 - 75.0 pg/mL 57.2   ALBUMIN Latest Ref Range: 3.4 - 4.8 g/dL 4.0     ASSESSMENT:  TANGIE STAY is a 62 y.o. female with PMH of HTN, HLD who presents for follow up of type I diabetes mellitus.     PLAN:  Better-Controlled Type I Diabetes Mellitus with More Frequent Hyperglycemia  -Complicated by recent hyperglycemia, mild peripheral neuropathy, past microalbuminuria (now good), diabetic retinopathy and macrovascular diseases including HTN, HLD  -Anti-GAD and anti-islet cell antibodies to evaluate for type 1 DM checked and positive for GAD antibody  -Most recent HgA1C 7.8% in 05/2017, down from 12.6% in 10/2016. Urine microalbumin/creatinine ratio 22.6 in 11/2016; on lisinopril 10 mg qDaily  -Annual diabetes foot exam done today, 07/15/2017  -Most recent diabetic eye exam 02/2017. Has diabetic retinopathy requiring eye injections. Follows with Dr. Lamona Curl in [place], [place]  -Hypoglycemia not occurring. Has glucagon injection. Discussed with patient how to decrease her Lantus if sugars low: decrease by 2 units every night until sugars 80-140 most days  -Will increase Lantus to 36 units qPM and instructed patient to check sugars every morning and increase her Lantus by 2 units every 2 days until blood sugars 80-140 most days  -Continue Humalog 16 units TID-AC plus sliding scale 1 unit for every 50 >150  -Patient likely has element of insulin resistance as she was requiring high doses of insulin. Since she never went into DKA, she likely has some pancreatic beta cell function. Will start Ozempic to help with glucose control and weight loss. Will start 0.25 mg weekly x 4 weeks then 0.5 mg weekly x 4 weeks then 1 mg weekly to stay on that dose. Discussed most common side effect of nausea with patient and informed her that this usually goes away in a few weeks. Discussed black box warning including medullary thyroid cancer in rats and discussed symptoms of pancreatitis and instructed her to call here or go to the ED if she experiences severe abdominal pain associated with nausea/vomiting. She has no personal or  family history of thyroid cancer or pancreatitis.   -Has glucometer, test strips, pen needles, lancets. Refilled [employer] supplies  -Most recent BMP 05/2017 with creatinine 0.40 and GFR >60  -Advised patient to check sugars 3-4x/day via [facility] and to bring sugar logs and glucometer to clinic appointments  -I spoke with our diabetes educator, Elizabeth Palau, about patient. Will attempt to obtain insulin pump; Provided Ozempic instruction      Hgba1c Goal <7%   Preprandial Glucose Target 80-140 mg/dL   Peak Postprandial Glucose Target <180 mg/dL    BP Goal: <130/80    Labs today: Will repeat HgA1C and BMP at next appointment   Vaccines:   Flu  vaccine annually- discuss with PCP   Hepatitis B- discuss with PCP    Hepatitis B, 3-dose series to unvaccinated adults with DM>60 y/o    Hepatitis B, 3-dose series to unvaccinated adults with DM 1-59 y/o    Pneumonia- discuss with PCP    All patients with DM 2-64 y/o with pneumococcal polysaccharide vaccine (PPSV23)   At age 23 years, administer the pneumococcal conjugate vaccine (PCV 13) at least 1 year after vaccination with PPSV23, followed by another dose of vaccine PPSV23 at least 1 year after the last dose of PPSV23    Yearly eye and daily foot exams discussed   Symptoms of potential hypoglycemia discussed and treatment of hypoglycemia discussed   Return visit in 3 months or earlier if needed    The patient will be contacted once any lab work is resulted    CC: Notes to referring physician      Palpable Right Thyroid Nodule- Resolved  -TSH WNL. Thyroid ultrasound done 03/06/17 without thyroid nodules with homogenous thyroid texture    Mild Hypercalcemia- Resolved  -Noted on BMP from 12/17/16. Possibly secondary to dehydration as sugars were in the 460s at that time  -Repeat BMP, albumin, PTH in 12/2016 were normal. Corrected calcium in 9s on 06/03/2017    Orders Placed This Encounter   . Referral to [facility]   . semaglutide (OZEMPIC) 0.25 mg or 0.5  mg(2 mg/1.5 mL) Subcutaneous Pen Injector   . semaglutide (OZEMPIC) 1 mg/dose (2 mg/1.5 mL) Subcutaneous Pen Injector   . insulin glargine (LANTUS SOLOSTAR U-100 INSULIN) 100 unit/mL Subcutaneous Insulin Pen   . insulin lispro (HUMALOG KWIKPEN INSULIN) 100 unit/mL Subcutaneous Insulin Pen     Keith Rake, MD, MPH 07/15/2017, 16:15        I saw and examined the patient.  I reviewed the fellow's note.  I agree with the findings and plan of care as documented in the fellow's note.  Any exceptions/additions are edited/noted.    Bridgett Larsson, MD

## 2017-07-15 ENCOUNTER — Ambulatory Visit
Payer: No Typology Code available for payment source | Attending: INTERNAL MEDICINE-ENDOCRINOLOGY-DIABETES AND METABOLISM | Admitting: "Endocrinology

## 2017-07-15 ENCOUNTER — Ambulatory Visit (INDEPENDENT_AMBULATORY_CARE_PROVIDER_SITE_OTHER): Payer: No Typology Code available for payment source

## 2017-07-15 ENCOUNTER — Encounter (HOSPITAL_BASED_OUTPATIENT_CLINIC_OR_DEPARTMENT_OTHER): Payer: Self-pay | Admitting: "Endocrinology

## 2017-07-15 VITALS — BP 134/82 | HR 99 | Ht 60.98 in | Wt 165.1 lb

## 2017-07-15 DIAGNOSIS — E1065 Type 1 diabetes mellitus with hyperglycemia: Secondary | ICD-10-CM | POA: Insufficient documentation

## 2017-07-15 DIAGNOSIS — Z794 Long term (current) use of insulin: Secondary | ICD-10-CM | POA: Insufficient documentation

## 2017-07-15 DIAGNOSIS — E109 Type 1 diabetes mellitus without complications: Secondary | ICD-10-CM

## 2017-07-15 DIAGNOSIS — I10 Essential (primary) hypertension: Secondary | ICD-10-CM | POA: Insufficient documentation

## 2017-07-15 DIAGNOSIS — E041 Nontoxic single thyroid nodule: Secondary | ICD-10-CM

## 2017-07-15 DIAGNOSIS — E785 Hyperlipidemia, unspecified: Secondary | ICD-10-CM | POA: Insufficient documentation

## 2017-07-15 DIAGNOSIS — M545 Low back pain, unspecified: Secondary | ICD-10-CM

## 2017-07-15 DIAGNOSIS — E1042 Type 1 diabetes mellitus with diabetic polyneuropathy: Secondary | ICD-10-CM | POA: Insufficient documentation

## 2017-07-15 DIAGNOSIS — E119 Type 2 diabetes mellitus without complications: Secondary | ICD-10-CM

## 2017-07-15 DIAGNOSIS — E10319 Type 1 diabetes mellitus with unspecified diabetic retinopathy without macular edema: Secondary | ICD-10-CM | POA: Insufficient documentation

## 2017-07-15 MED ORDER — SEMAGLUTIDE 0.25 MG OR 0.5 MG (2 MG/1.5 ML) SUBCUTANEOUS PEN INJECTOR: Syringe | SUBCUTANEOUS | 0 refills | 0 days | Status: DC

## 2017-07-15 MED ORDER — SEMAGLUTIDE 1 MG/DOSE (2 MG/1.5 ML) SUBCUTANEOUS PEN INJECTOR: 1 mg | Syringe | SUBCUTANEOUS | 3 refills | 0 days | Status: DC

## 2017-07-15 MED ORDER — INSULIN GLARGINE (U-100) 100 UNIT/ML (3 ML) SUBCUTANEOUS PEN
PEN_INJECTOR | SUBCUTANEOUS | 5 refills | Status: DC
Start: 2017-07-15 — End: 2018-03-31

## 2017-07-15 MED ORDER — INSULIN LISPRO (U-100) 100 UNIT/ML SUBCUTANEOUS PEN
PEN_INJECTOR | SUBCUTANEOUS | 3 refills | Status: DC
Start: 2017-07-15 — End: 2017-11-11

## 2017-07-15 NOTE — Patient Instructions (Signed)
We will increase your long-acting insulin (Lantus) to 36 units nightly. Keep checking your sugar every morning. If your morning sugar is greater than 140, increase your Lantus by 2 units every 2 night until morning sugars are most of the time 80-140. If your morning sugar is less than 80, decrease your Lantus by 2 units every night until your morning sugars are most of the time 80-140.     We will increase your meal time short-acting insulin (Novolog) to 16 units three times a day with meals plus sliding scale 1 unit for 50 >150 as follows:  If your pre-meal blood sugar is 150: Take 0 extra units (total 16 units)  If your pre-meal blood sugar is 151-200: Take 1 extra units (total 17 units)  If your pre-meal blood sugar is 201-250: Take 2 extra units (total 18 units)  If your pre-meal blood sugar is 251-300: Take 3 extra units (total 19 units)  If your pre-meal blood sugar is 301-350: Take 4 extra units (total 20 units)  If your pre-meal blood sugar is 351-400: Take 5 extra units (total 21 units)  If your pre-meal blood sugar is more than 400: Take 6 extra units (total 22 units) and call the clinic and ask to speak to our diabetes educator, Justin MendSheree    We will start you on Ozempic at 0.25 mg weekly for 4 weeks then increase to 0.5 mg weekly for 4 weeks then increase to 1 mg weekly and stay on that dose.  We will work on getting you an insulin pump in the meantime and will talk to your insurance company to see what they will cover.

## 2017-07-17 ENCOUNTER — Telehealth (HOSPITAL_BASED_OUTPATIENT_CLINIC_OR_DEPARTMENT_OTHER): Payer: Self-pay | Admitting: "Endocrinology

## 2017-07-17 NOTE — Telephone Encounter (Signed)
Received labs.  Already reviewed by provider. Entered and sent to scan. Emogene MorganLisa Mckaylie Vasey, MA  07/17/2017, 13:05

## 2017-07-23 ENCOUNTER — Telehealth (HOSPITAL_BASED_OUTPATIENT_CLINIC_OR_DEPARTMENT_OTHER): Payer: Self-pay | Admitting: "Endocrinology

## 2017-07-23 NOTE — Telephone Encounter (Signed)
[facility] Form needs completed by provider and faxed back to sender. Paperwork placed in provider's box. Milinda Hirschfeld, LPN  05/29/6438, 34:74

## 2017-07-25 ENCOUNTER — Encounter (INDEPENDENT_AMBULATORY_CARE_PROVIDER_SITE_OTHER): Payer: Self-pay

## 2017-07-25 ENCOUNTER — Ambulatory Visit (INDEPENDENT_AMBULATORY_CARE_PROVIDER_SITE_OTHER): Payer: Self-pay

## 2017-07-25 ENCOUNTER — Ambulatory Visit: Payer: No Typology Code available for payment source

## 2017-07-25 VITALS — BP 118/82 | HR 92 | Temp 97.7°F | Ht 62.64 in | Wt 164.5 lb

## 2017-07-25 DIAGNOSIS — M545 Low back pain, unspecified: Secondary | ICD-10-CM

## 2017-07-25 DIAGNOSIS — R531 Weakness: Secondary | ICD-10-CM | POA: Insufficient documentation

## 2017-07-25 DIAGNOSIS — R202 Paresthesia of skin: Secondary | ICD-10-CM | POA: Insufficient documentation

## 2017-07-25 DIAGNOSIS — M79604 Pain in right leg: Secondary | ICD-10-CM | POA: Insufficient documentation

## 2017-07-25 NOTE — H&P (Signed)
[place]  Date of Service: 07/25/2017  Referring physician: Clementeen Hoof, MD  [facility]  [address]  [place],  [address]    Gender: female  Handedness: Right handed  Marital Status: Married   Job Title (or Former Job): Semi-retired Furniture conservator/restorer Complaint:   Chief Complaint   Patient presents with   . New Patient     LBP/Spine Ctr no imaging      History is provided by patient    History of Present Illness  This is a 62 y/o right handed female here today for evaluation of low back pain, BLE pain numbness/tingling along with left calf weakness that has been progressively worsening over the past year. Low back pain 60% (described as constant sharp/stabbing/dull aching pain) is worse then leg pain 40% (shooting/burning sensation)in BLE, right leg is worse. She also reports left calf numbness. Symptoms are worsened with prolonged activity (standing, sitting and walking). Symptoms improved with lying down/reclining, resting and medication (takes ibuprofen PRN and Zanaflex). She has tried chiropractic in 2019 with success. No PT or ESI. No reported spine surgeries. She is on (ASA) Aspirin 81 mg. Here today without imaging for NSGY evaluation and management of symptoms.    Today, patient states doing fair. Pain rated as a 8/10 described above. Patient denies nausea, vomiting, diarrhea, SOB and bowel or bladder dysfunction. No falls, seizures, blurry vision or headaches. Other pertinent ROS negative. Husband offered important information as to patient status. No tobacco. PMH: DM 1, HLD, HTN and Diabetic retinopathy. Denies use of assistive devices.    Past History  Current Outpatient Medications   Medication Sig   . aspirin (ECOTRIN) 81 mg Oral Tablet, Delayed Release (E.C.) Take 81 mg by mouth Once a day   . atorvastatin (LIPITOR) 20 mg Oral Tablet Take 1 Tab (20 mg total) by mouth Once a day   . Blood Sugar Diagnostic (ONETOUCH  VERIO) Strip To check sugars 4 times daily as directed   . cholecalciferol, Vitamin D3, (VITAMIN D-3) 5,000 unit Oral Tablet Take 5,000 Units by mouth Once a day   . clotrimazole (LOTRIMIN AF, CLOTRIMAZOLE,) 1 % Cream Apply topically Twice daily   . flash glucose scanning reader (FREESTYLE LIBRE 14 DAY READER) Does not apply Misc To test blood sugars E10.9   . flash glucose sensor (FREESTYLE LIBRE 14 DAY SENSOR) Does not apply Kit To test blood sugars E10.9   . glucagon (GLUCAGEN) 1 mg/mL Injection Recon Soln 1 mg by Intramuscular route Once, as needed for Other (Blood sugar less than 70 and cannot eat) for up to 1 dose   . insulin glargine (LANTUS SOLOSTAR U-100 INSULIN) 100 unit/mL Subcutaneous Insulin Pen Take up to 90 units nightly as instructed   . insulin lispro (HUMALOG KWIKPEN INSULIN) 100 unit/mL Subcutaneous Insulin Pen Take up to 30 units three times a day as instructed   . Insulin Needles, Disposable, (PEN NEEDLE) 32 gauge x 5/32" Needle To inject 4 times per day   . lancets (ONE TOUCH DELICA) 33 gauge Misc To check sugars 4 times daily as directed   . lisinopril (PRINIVIL) 10 mg Oral Tablet Take 10 mg by mouth Once a day   . miconazole nitrate (LOTRIMIN AF) 2 % Powder Apply topically Twice daily   . multivit with iron,hematinic (SUPER B-COMPLEX ORAL) Take 1 Tab by mouth Once a day   . semaglutide (OZEMPIC) 0.25  mg or 0.5 mg(2 mg/1.5 mL) Subcutaneous Pen Injector 0.25 weekly x 4 weeks then 0.5 mg weekly x 4 weeks then 1 mg weekly   . semaglutide (OZEMPIC) 1 mg/dose (2 mg/1.5 mL) Subcutaneous Pen Injector 1 mg by Subcutaneous route Every 7 days   . tiZANidine (ZANAFLEX) 4 mg Oral Tablet Take 4 mg by mouth Three times a day   . vits A,C,E/zinc/copper (VISION-VITE PRESERVE ORAL) Take 1 Tab by mouth Twice daily     No Known Allergies  Past Medical History:   Diagnosis Date   . Diabetic retinopathy (CMS [place])    . HLD (hyperlipidemia)    . HTN (hypertension)    . Type I diabetes mellitus (CMS HCC)      Past  Surgical History:   Procedure Laterality Date   . ENDOMETRIAL ABLATION     . HX TONSILLECTOMY     . HX WISDOM TEETH EXTRACTION     . TRIGGER FINGER RELEASE       Family History  Family Medical History:     Problem Relation (Age of Onset)    Coronary Artery Disease Mother    Diabetes Father    Diabetes type II Father, Brother, Paternal Uncle, Sister    Hypothyroidism Mother, Sister    Multiple Sclerosis Brother    Other Mother, Father        Social History  Social History     Socioeconomic History   . Marital status: Married     Spouse name: Not on file   . Number of children: Not on file   . Years of education: Not on file   . Highest education level: Not on file   Occupational History   . Occupation: semi-retired Oceanographer   Tobacco Use   . Smoking status: Never Smoker   . Smokeless tobacco: Never Used   Substance and Sexual Activity   . Alcohol use: No   . Drug use: No   . Sexual activity: Yes     Partners: Male   Other Topics Concern   . Uses Cane No   . Uses walker No   . Uses wheelchair No   . Right hand dominant Yes   Social History Narrative    No illicit drug use.     Examination  BP 118/82   Pulse 92   Temp 36.5 C (97.7 F) (Tympanic)   Ht 1.591 m (5' 2.64")   Wt 74.6 kg (164 lb 7.4 oz)   SpO2 98%   BMI 29.47 kg/m   General appearance: well groomed, appeared in no apparent distress.  HENT  Head: Normocephalic, atraumatic  Eyes: PERRL, EOMI, conjunctiva clear, optic discs and posterior segments- difficult to visualize, no obvious abnormalities  Throat: Tongue midline, trachea midline  Cardiovascular  Auscultation: RRR  Carotid arteries: Negative for carotid bruits  PV: peripheral pulses 2+  Respiratory: CTA bilaterally, unlabored respirations, diminished in the bases.  Abdomen: Bowel sounds present, abdominal tenderness noted  Musculoskeletal:   Strength:  Arm Right Left Leg Right Left   Deltoid 5/5 5/5 Hip Flexion 5/5 5/5   Biceps 5/5 5/5 Knee Flexion 5/5 5/5   Triceps 5/5 5/5 Knee  Extension 5/5 4+/5   Grip 5/5 5/5 Dorsiflexion 5/5 4+/5   Wrist Flexion 5/5 5/5 Plantarflexion 5/5 4+/5   Wrist Extension 5/5 5/5                Muscle tone (upper extremities): Normal  Muscle tone (lower extremities): Normal  Neurological  Gait and station: antalgic from mid low back, slow and steady  Sensation:Intact  Cerebellar: Rapid alternating movements and finger to nose intact. Romberg- maintains balance  No drift  DTR's:    RJ BJ TJ KJ AJ Babinski Hoffman's   Right 2+ 2+ 2+ 1+ 1+ Downgoing Not present   Left 2+ 2+ 2+ 1+ 1+ Downgoing Not present   Ankle clonus negative  Straight leg raise negative  Patrick's test negative bilaterally  Mental Status Exam  Orientation: Alert and oriented x3  Recent and remote memory: Intact  Attention span and concentration: Normal  Language: Normal  Fund of knowledge: Normal  Cranial Nerves  CN 2: PERRL  CN 3,4,6: EOMI, no nystagmus  CN 5: Facial sensation intact  CN 7: No facial asymmetry  CN 8: Hearing intact  CN 9,10: Palate symmetric  CN 11: Normal shoulder shrug  CN 12: Tongue midline    Data reviewed  No imaging available for today.    Assessment:      ICD-10-CM    1. Left leg paresthesias R20.2 External Referral to Physical Therapy for Orthopaedics Spine Patients     MRI SPINE LUMBOSACRAL WO CONTRAST   2. Low back pain M54.5 Referral to Spine Center     External Referral to Physical Therapy for Orthopaedics Spine Patients     MRI SPINE LUMBOSACRAL WO CONTRAST   3. Right leg paresthesias R20.2 External Referral to Physical Therapy for Orthopaedics Spine Patients     MRI SPINE LUMBOSACRAL WO CONTRAST     Treatment Plan    -The natural history, film findings and indications for treatment were discussed. Patient has not completed conservative management. Script for PT provided. Patient requested pain medication, however I discussed that we are unable to accommodate her request and that she would have to either follow with PCP or then be referred to pain management. For  what appears to be constipation like complaints including mild abdominal pain, the patient was instructed to follow with PCP for management.     -Recommended to follow up in [place] clinic 4 weeks after PT, if not doing better, obtain MRI Lumbar and Dr. Patrice Paradise will then evaluate for surgery.     -Continue Medical Management (Diet, Exercise, Medication).    -The patient has been advised to follow up with their PCP in regards to any chronic medical conditions and any non-neurosurgical symptoms they may have.    The patient was seen as a shared visit with the co-signing faculty.    Alysia Penna, APRN,FNP-BC 07/25/2017, 09:18

## 2017-07-25 NOTE — H&P (Deleted)
[facility]  Date of Service: 07/25/2017  Referring physician: Clementeen Hoof, MD  [facility]  [address]  [place], [place] [address]    Gender: female  Handedness: {NS HANDEDNESS (AMB):[phone]}  Marital Status: {MARITAL STATUS (AMB):18919:x}   Job Title (or Former Job): ***    Chief Complaint: No chief complaint on file.    History is provided by {HISTORY PROVIDED BY:19225::"patient"}    History of Present Illness  Isabella Terrell is a 62 year old female   Past History  Current Outpatient Medications   Medication Sig   . atorvastatin (LIPITOR) 20 mg Oral Tablet Take 1 Tab (20 mg total) by mouth Once a day   . Blood Sugar Diagnostic (ONETOUCH VERIO) Strip To check sugars 4 times daily as directed   . flash glucose scanning reader (FREESTYLE LIBRE 14 DAY READER) Does not apply Misc To test blood sugars E10.9   . flash glucose sensor (FREESTYLE LIBRE 14 DAY SENSOR) Does not apply Kit To test blood sugars E10.9   . glucagon (GLUCAGEN) 1 mg/mL Injection Recon Soln 1 mg by Intramuscular route Once, as needed for Other (Blood sugar less than 70 and cannot eat) for up to 1 dose   . insulin glargine (LANTUS SOLOSTAR U-100 INSULIN) 100 unit/mL Subcutaneous Insulin Pen Take up to 90 units nightly as instructed   . insulin lispro (HUMALOG KWIKPEN INSULIN) 100 unit/mL Subcutaneous Insulin Pen Take up to 30 units three times a day as instructed   . Insulin Needles, Disposable, (PEN NEEDLE) 32 gauge x 5/32" Needle To inject 4 times per day   . lancets (ONE TOUCH DELICA) 33 gauge Misc To check sugars 4 times daily as directed   . lisinopril (PRINIVIL) 10 mg Oral Tablet Take 10 mg by mouth Once a day   . semaglutide (OZEMPIC) 0.25 mg or 0.5 mg(2 mg/1.5 mL) Subcutaneous Pen Injector 0.25 weekly x 4 weeks then 0.5 mg weekly x 4 weeks then 1 mg weekly   . semaglutide (OZEMPIC) 1 mg/dose (2 mg/1.5 mL) Subcutaneous Pen Injector 1 mg by Subcutaneous route  Every 7 days     No Known Allergies  Past Medical History:   Diagnosis Date   . Diabetic retinopathy (CMS [place])    . HLD (hyperlipidemia)    . HTN (hypertension)    . Type I diabetes mellitus (CMS HCC)          Past Surgical History:   Procedure Laterality Date   . ENDOMETRIAL ABLATION     . HX WISDOM TEETH EXTRACTION     . TRIGGER FINGER RELEASE         Family History  Family Medical History:     Problem Relation (Age of Onset)    Diabetes type II Father, Brother, Paternal Uncle, Sister    Hypothyroidism Mother, Sister    Multiple Sclerosis Brother              Social History  Social History     Socioeconomic History   . Marital status: Married     Spouse name: Not on file   . Number of children: Not on file   . Years of education: Not on file   . Highest education level: Not on file   Tobacco Use   . Smoking status: Never Smoker   . Smokeless tobacco: Never Used   Substance and Sexual Activity   . Alcohol use: No   .  Drug use: No       Review of Systems  {Ros - complete:30496}    Examination  There were no vitals taken for this visit.        Constitutional  General appearance: {NORMAL/ABNORMAL:20180}  Eyes: Ophthalmic exam of optic discs and posterior segments: {NORMAL/ABNORMAL:20180}  Cardiovascular:   Carotid arteries: {NORMAL/ABNORMAL:20180}  Auscultation: {NORMAL/ABNORMAL:20180}  Peripheral vascular system: {NORMAL/ABNORMAL:20180}  Musculoskeletal  Gait and Station: : {NORMAL/ABNORMAL:20180}  Muscle strength (upper extremities): : {NORMAL/ABNORMAL:20180}  Muscle strength (lower extremities): : {NORMAL/ABNORMAL:20180}  Muscle tone (upper extremities): : {NORMAL/ABNORMAL:20180}  Muscle tone (lower extremities): : {NORMAL/ABNORMAL:20180}  Sensation: {NORMAL/ABNORMAL:20180}  Deep tendon reflexes upper and lower extremities: {NORMAL/ABNORMAL:20180}  Coordination: {NORMAL/ABNORMAL:20180}  Neurological  Orientation: {NORMAL/ABNORMAL:20180}  Recent and remote memory: {NORMAL/ABNORMAL:20180}  Attention span and  concentration: {NORMAL/ABNORMAL:20180}  Language: {NORMAL/ABNORMAL:20180}  Fund of knowledge: {NORMAL/ABNORMAL:20180}  Cranial Nerves  2nd: {NORMAL/ABNORMAL:20180}  3rd,4th,6th: {NORMAL/ABNORMAL:20180}  5th: {NORMAL/ABNORMAL:20180}  7th: {NORMAL/ABNORMAL:20180}  8th: {NORMAL/ABNORMAL:20180}  9th: {NORMAL/ABNORMAL:20180}  11th: {NORMAL/ABNORMAL:20180}  12th: {NORMAL/ABNORMAL:20180}    Data reviewed      Discussions with other providers:   Reviewed chart notes     Assessment:      ICD-10-CM    1. Low back pain M54.5 Referral to [facility]    The patient was seen as a shared visit with the co-signing faculty.    Cheral Marker, APRN,FNP-BC 07/25/2017, 08:08    With Dr. Patrice Paradise

## 2017-07-25 NOTE — Telephone Encounter (Signed)
Form completed and faxed back with notes.  Fax confirmed.  Emogene Morgan, MA  07/25/2017, 14:34

## 2017-07-29 ENCOUNTER — Other Ambulatory Visit (HOSPITAL_COMMUNITY): Payer: Self-pay

## 2017-07-29 DIAGNOSIS — Z1231 Encounter for screening mammogram for malignant neoplasm of breast: Secondary | ICD-10-CM

## 2017-08-12 ENCOUNTER — Ambulatory Visit (INDEPENDENT_AMBULATORY_CARE_PROVIDER_SITE_OTHER): Payer: Self-pay | Admitting: Physician Assistant

## 2017-08-23 ENCOUNTER — Other Ambulatory Visit (HOSPITAL_BASED_OUTPATIENT_CLINIC_OR_DEPARTMENT_OTHER): Payer: Self-pay | Admitting: "Endocrinology

## 2017-08-23 DIAGNOSIS — E785 Hyperlipidemia, unspecified: Secondary | ICD-10-CM

## 2017-08-27 ENCOUNTER — Telehealth (INDEPENDENT_AMBULATORY_CARE_PROVIDER_SITE_OTHER): Payer: Self-pay

## 2017-08-27 NOTE — Telephone Encounter (Signed)
I called patient to schedule follow up appt with Dr. Noel Geroldohen. Patient was looking to schedule after the end of June. There is currently no template for Dr. Wells Guilesohen's schedule in July yet. Will call back closer to the end of June.

## 2017-09-26 ENCOUNTER — Other Ambulatory Visit (INDEPENDENT_AMBULATORY_CARE_PROVIDER_SITE_OTHER): Payer: Self-pay | Admitting: Neurological Surgery

## 2017-09-26 MED ORDER — DIAZEPAM 5 MG TABLET
ORAL_TABLET | ORAL | 0 refills | Status: DC
Start: 2017-09-26 — End: 2018-09-08

## 2017-09-26 NOTE — Telephone Encounter (Addendum)
Regarding: [name]  ----- Message from Blenda PealsSharee K Smith sent at 09/22/2017  1:23 PM EDT -----  Isabella Terrell is scheduled for an MRI on 10/14/17 and is claustrophobic    She would like something called in for her to take prior to the testing     Please call Isabella Terrell at [phone]    Preferred Pharmacy     [id number] [place] - [address]    [address] [place] [id number]    Phone: [phone] Fax: [phone]    Not a 24 hour pharmacy; exact hours not known.       -------------------------------------------  Valium 5 mg by mouth to take 45 minutes prior to MRI called in to pharmacy. Patient aware.   Joanette GulaKathleen D Thoams Siefert, RN  09/26/2017, 09:10

## 2017-10-03 ENCOUNTER — Ambulatory Visit
Admission: RE | Admit: 2017-10-03 | Discharge: 2017-10-03 | Disposition: A | Discharge status: Home / Self Care | Payer: No Typology Code available for payment source | Source: Ambulatory Visit

## 2017-10-03 ENCOUNTER — Encounter (HOSPITAL_COMMUNITY): Payer: Self-pay

## 2017-10-03 DIAGNOSIS — Z1231 Encounter for screening mammogram for malignant neoplasm of breast: Secondary | ICD-10-CM | POA: Insufficient documentation

## 2017-10-14 ENCOUNTER — Ambulatory Visit (INDEPENDENT_AMBULATORY_CARE_PROVIDER_SITE_OTHER): Payer: No Typology Code available for payment source | Admitting: Neurological Surgery

## 2017-10-14 ENCOUNTER — Encounter (INDEPENDENT_AMBULATORY_CARE_PROVIDER_SITE_OTHER): Payer: Self-pay | Admitting: Neurological Surgery

## 2017-10-14 ENCOUNTER — Encounter (HOSPITAL_BASED_OUTPATIENT_CLINIC_OR_DEPARTMENT_OTHER): Payer: Self-pay | Admitting: "Endocrinology

## 2017-10-14 ENCOUNTER — Ambulatory Visit
Admission: RE | Admit: 2017-10-14 | Discharge: 2017-10-14 | Disposition: A | Discharge status: Home / Self Care | Payer: No Typology Code available for payment source | Source: Ambulatory Visit

## 2017-10-14 VITALS — BP 124/64 | HR 76 | Temp 97.6°F | Ht 62.4 in

## 2017-10-14 DIAGNOSIS — M545 Low back pain, unspecified: Secondary | ICD-10-CM

## 2017-10-14 DIAGNOSIS — R202 Paresthesia of skin: Secondary | ICD-10-CM | POA: Insufficient documentation

## 2017-10-14 DIAGNOSIS — M48061 Spinal stenosis, lumbar region without neurogenic claudication: Secondary | ICD-10-CM

## 2017-10-14 DIAGNOSIS — M47816 Spondylosis without myelopathy or radiculopathy, lumbar region: Secondary | ICD-10-CM

## 2017-10-14 DIAGNOSIS — M5126 Other intervertebral disc displacement, lumbar region: Secondary | ICD-10-CM

## 2017-10-14 NOTE — Progress Notes (Signed)
I personally saw and evaluated the patient. See mid-level's note for additional details. My findings/participation are as below.    Ms. Isabella Terrell presents for f/u.  She has undergone PT, and actually tells me that her mild LBP has improved significantly over the last several weeks.  She denies radicular pain in the LE, but does have some numbness from mid-thigh to mid-calf b/l.  Ms. Isabella Terrell's lumbar MRI reveals mild degenerative findings, with mild canal stenosis at L3-4.  I do not recommend lumbar surgery in light of her mild structural findings and recent symptomatic improvement, and will refer her to neurology for neuropathy w/u.    Janeann Merlavid Benjamin Ricka Westra, MD

## 2017-10-14 NOTE — Progress Notes (Signed)
[facility]  Return Outpatient Note    Date:  10/14/2017  Age:  62 y.o.  Referring Physician:   Juan Quam., MD  [facility]  [address]  [place], [place] [address]    Subjective:   Isabella Terrell is a 62 year old female returning to clinic with MRI lumbar imaging. The patient was seen as a new patient on 07/25/17 with no imaging with complaint of low back and BLE numbness and tingling. She was prescribed PT and advised to call if symptoms did not improve.    She reports pain as 2/10 described as intermittent right sided lumbar pain with skin sensitivity bilateral mid calf to mid thigh and leg heaviness. She reports sensation of ice pick to right thigh occurring intermittently at least daily. Denies recent fall, weakness, or bb incontinence. She has tried chiropractic in 2019 with success. No ESI. No reported spine surgeries. She went to twice weekly PT with exercises.       Current Outpatient Medications   Medication Sig   . aspirin (ECOTRIN) 81 mg Oral Tablet, Delayed Release (E.C.) Take 81 mg by mouth Once a day   . atorvastatin (LIPITOR) 20 mg Oral Tablet Take 1 Tab (20 mg total) by mouth Once a day   . Blood Sugar Diagnostic (ONETOUCH VERIO) Strip To check sugars 4 times daily as directed   . cholecalciferol, Vitamin D3, (VITAMIN D-3) 5,000 unit Oral Tablet Take 5,000 Units by mouth Once a day   . clotrimazole (LOTRIMIN AF, CLOTRIMAZOLE,) 1 % Cream Apply topically Twice daily   . diazePAM (VALIUM) 5 mg Oral Tablet Valium 5 mg by mouth . Take 45 minutes prior to MRI.   . flash glucose scanning reader (FREESTYLE LIBRE 14 DAY READER) Does not apply Misc To test blood sugars E10.9   . flash glucose sensor (FREESTYLE LIBRE 14 DAY SENSOR) Does not apply Kit To test blood sugars E10.9   . glucagon (GLUCAGEN) 1 mg/mL Injection Recon Soln 1 mg by Intramuscular route Once, as needed for Other (Blood sugar less than 70 and cannot eat) for up to 1 dose   . insulin glargine (LANTUS  SOLOSTAR U-100 INSULIN) 100 unit/mL Subcutaneous Insulin Pen Take up to 90 units nightly as instructed   . insulin lispro (HUMALOG KWIKPEN INSULIN) 100 unit/mL Subcutaneous Insulin Pen Take up to 30 units three times a day as instructed   . Insulin Needles, Disposable, (PEN NEEDLE) 32 gauge x 5/32" Needle To inject 4 times per day   . lancets (ONE TOUCH DELICA) 33 gauge Misc To check sugars 4 times daily as directed   . lisinopril (PRINIVIL) 10 mg Oral Tablet Take 10 mg by mouth Once a day   . miconazole nitrate (LOTRIMIN AF) 2 % Powder Apply topically Twice daily   . multivit with iron,hematinic (SUPER B-COMPLEX ORAL) Take 1 Tab by mouth Once a day   . semaglutide (OZEMPIC) 0.25 mg or 0.5 mg(2 mg/1.5 mL) Subcutaneous Pen Injector 0.25 weekly x 4 weeks then 0.5 mg weekly x 4 weeks then 1 mg weekly   . semaglutide (OZEMPIC) 1 mg/dose (2 mg/1.5 mL) Subcutaneous Pen Injector 1 mg by Subcutaneous route Every 7 days (Patient taking differently: 0.5 mg by Subcutaneous route Every 7 days )   . tiZANidine (ZANAFLEX) 4 mg Oral Tablet Take 4 mg by mouth Three times a day   . vits A,C,E/zinc/copper (VISION-VITE PRESERVE ORAL) Take 1 Tab by mouth Twice daily  Objective:   Vital Signs:  BP 124/64   Pulse 76   Temp 36.4 C (97.6 F) (Thermal Scan)   Ht 1.585 m (5' 2.4")   LMP  (LMP Unknown)   SpO2 97%   BMI 29.69 kg/m     Constitutional  General appearance: In no acute distress   Cardiovascular:   Peripheral vascular system: palpable pulses   HEENT:  Heent:  Normal  Musculoskeletal  Gait and Station: : Normal  Muscle strength (upper extremities): : Normal  Muscle strength (lower extremities): : Normal  Muscle tone (upper extremities): : Normal  Muscle tone (lower extremities): : Normal  Sensation: Normal  Deep tendon reflexes upper and lower extremities: Not done  Hoffman's reflex: Left: negative Right: negative  Ankle clonus:Left : Not present Right Not present  Musculoskeletal tenderness:  negative    Neurological  Orientation: Normal  Recent and remote memory: Normal  Attention span and concentration: Normal  Language: Normal  Fund of knowledge: Normal    Data reviewed  10/14/17 MRI lumbar spine [facility] PACS:   Not officially read. Mild degenerative findings, with mild canal stenosis at L3-4.    Discussions with other providers:   Reviewed chart notes     Assessment:      ICD-10-CM    1. Low back pain M54.5 Referral to Neurology   2. Left leg paresthesias R20.2 Referral to Neurology   3. Right leg paresthesias R20.2 Referral to Neurology       Recommendations  -Refer to Neurology for neuropathy   -Return to NS clinic as needed     Cheral Marker, APRN,FNP-BC 10/14/2017, 14:35    With Dr. Patrice Paradise

## 2017-10-17 ENCOUNTER — Ambulatory Visit (INDEPENDENT_AMBULATORY_CARE_PROVIDER_SITE_OTHER): Payer: Self-pay | Admitting: Neurology

## 2017-11-07 NOTE — Progress Notes (Signed)
[facility]  RETURN VISIT    Chief Complaint:  Type I Diabetes Mellitus   Referring Provider: Marlene Lard, APRN-FNP-BC  PCP: Marlene Lard, APRN-FNP-BC    HPI:   Isabella Terrell is a 62 y.o. female with PMH of HTN, HLD who presents for follow up of type I diabetes mellitus.     Past history:  -Diagnosed with type II diabetes mellitus about 25 years ago after presenting to her PCP with polyuria  -Has never been hospitalized for high or low sugars  -Was having multiple UTI and yeast infections so her gynecologist checked her HgA1C on 11/21/16 which was elevated to 12.6%  -When established here, GAD and islet cell antibodies were checked to evaluate for type I DM with GAD antibodies coming back positive  -Previously on Januvia 100 mg qDaily and metformin 1000 mg BID discontinued after type I diagnosis  -Now on Lantus 32 units qPM (was previously up to 72 units nightly) and Novolog around 22 units TID-AC in addition to Ozempic 1 mg weekly as patient has some element of insulin resistance    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 She checks her sugars 6 times a day. Morning sugars are in the 120-150s most days with occasional 170-200s. Pre-lunch are usually 100-250s with rare 311. Before dinner are high in 190-240s and before bedtime are in the 150-220s. She states that about 1 month ago, she was in a store and felt strange and checked her sugar and it was 83. She states that she just "feels strange" when her sugars are low but cannot describe how. She has not had a low <70 in the past few months. She treats lows with food and juice. She is concerned about her weight gain. She was previously not taking insulin about a year ago as it made her gain weight so she was letting her sugars run high with A1C 12.6% when she first established here in 02/2017. She is now compliant with her insulin and has gained about 12 pounds in the past 10 months. Her weight gain has leveled out since starting Ozempic but she  would still like to go back to being in the 150 pound range rather than 160s. She states that her husband is also on Ozempic and losing weight. She states that she eats very low carb diet and that she does not eat much. 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 She was having some numbness in her lower extremities worse in the left leg which has improved. She saw Neurosurgery who did not recommend any surgical intervention and recommended she see Neurology.     DIABETIC COMPLICATIONS:  Microvascular Complications:    Peripheral/Autonomic Neuropathy: +Mild peripheral neuropathy, not currently on medication   Renal: Last GFR >59 05/2017, Microalb/crt ratio 22.6 on 12/17/16. Currently on lisinopril 10 mg qDaily   Retinopathy: Last eye exam 02/2017. Has diabetic retinopathy requiring eye injections. Follows with Dr. Lamona Curl in [place], [place]  Macrovascular Complications:   Hx of CAD: No   Hx of statin/ASA: On Lipitor, no aspirin; SGLT2 or GLP-1 therapy? No SGLT-2; currently on Ozempic   Hx of Stroke: No   Hx of HTN: Yes   Hx of HLD: Yes     CURRENT ANTI-DIABETIC REGIMEN:  Patient is adherent to meds   Lantus 32 units qPM   Humalog on sliding scale about 22 units TID-AC   Ozempic 1 mg weekly  PREVIOUS ANTI-DIABETIC MEDS:   Januvia- D/Ced when diagnosed with type I DM   Metformin- D/Ced when diagnosed with type I DM     BLOOD GLUCOSE TRENDS: Testing BG 6 times per day   Time Readings   Before Breakfast 120-150, some 170-200s   Before Lunch 100-250s, rare 300s   Before Supper 190-240s   Bedtime  150-220s      HYPOGLYCEMIA EVALUATION:  Is it occurring? Symptoms? None recently; feels strange when low but has difficulty describing exactly how feels   Hypoglycemic Event: (Date/Time) None in past few months   Treatment: Food, juice   Assisted or Unassisted: Unassisted   Hypoglycemic unawareness? No; feels weird when sugars in 70s        REVIEW OF  SYSTEMS:  In addition to HPI...  Constitutional: negative  Eyes: negative  Ears, nose, mouth, throat, and face: negative  Respiratory: negative  Cardiovascular: negative  Gastrointestinal: negative  Genitourinary:negative  Integument/breast: negative  Hematologic/lymphatic: negative  Musculoskeletal:negative  Neurological: negative  Behavioral/Psych: negative  Endocrine: negative  Allergic/Immunologic: negative  All other ROS Negative    Past Medical History:   Diagnosis Date   . Diabetic retinopathy (CMS [place] [place])    . HLD (hyperlipidemia)    . HTN (hypertension)    . Type I diabetes mellitus (CMS [facility])      Past Surgical History:   Procedure Laterality Date   . ENDOMETRIAL ABLATION     . HX TONSILLECTOMY     . HX WISDOM TEETH EXTRACTION     . TRIGGER FINGER RELEASE       Social History     Socioeconomic History   . Marital status: Married     Spouse name: Not on file   . Number of children: Not on file   . Years of education: Not on file   . Highest education level: Not on file   Occupational History   . Occupation: semi-retired Oceanographer   Social Needs   . Financial resource strain: Not on file   . Food insecurity:     Worry: Not on file     Inability: Not on file   . Transportation needs:     Medical: Not on file     Non-medical: Not on file   Tobacco Use   . Smoking status: Never Smoker   . Smokeless tobacco: Never Used   Substance and Sexual Activity   . Alcohol use: No   . Drug use: No   . Sexual activity: Yes     Partners: Male   Lifestyle   . Physical activity:     Days per week: Not on file     Minutes per session: Not on file   . Stress: Not on file   Relationships   . Social connections:     Talks on phone: Not on file     Gets together: Not on file     Attends religious service: Not on file     Active member of club or organization: Not on file     Attends meetings of clubs or organizations: Not on file     Relationship status: Not on file   . Intimate partner violence:     Fear of current or ex  partner: Not on file     Emotionally abused: Not on file     Physically abused: Not on file     Forced sexual activity: Not on file   Other Topics Concern   .  Uses Cane No   . Uses walker No   . Uses wheelchair No   . Right hand dominant Yes   . Left hand dominant Not Asked   . Ambidextrous Not Asked   . Shift Work Not Asked   . Unusual Sleep-Wake Schedule Not Asked   Social History Narrative    No illicit drug use.     Family Medical History:     Problem Relation (Age of Onset)    Coronary Artery Disease Mother    Diabetes Father    Diabetes type II Father, Brother, Paternal Uncle, Sister    Hypothyroidism Mother, Sister    Multiple Sclerosis Brother    Other Mother, Father        Current Outpatient Medications   Medication Sig   . aspirin (ECOTRIN) 81 mg Oral Tablet, Delayed Release (E.C.) Take 81 mg by mouth Once a day   . atorvastatin (LIPITOR) 20 mg Oral Tablet Take 1 Tab (20 mg total) by mouth Once a day   . Blood Sugar Diagnostic (ONETOUCH VERIO) Strip To check sugars 4 times daily as directed   . cholecalciferol, Vitamin D3, (VITAMIN D-3) 5,000 unit Oral Tablet Take 5,000 Units by mouth Once a day   . clotrimazole (LOTRIMIN AF, CLOTRIMAZOLE,) 1 % Cream Apply topically Twice daily   . diazePAM (VALIUM) 5 mg Oral Tablet Valium 5 mg by mouth . Take 45 minutes prior to MRI.   . flash glucose scanning reader (FREESTYLE LIBRE 14 DAY READER) Does not apply Misc To test blood sugars E10.9   . flash glucose sensor (FREESTYLE LIBRE 14 DAY SENSOR) Does not apply Kit To test blood sugars E10.9   . glucagon (GLUCAGEN) 1 mg/mL Injection Recon Soln 1 mg by Intramuscular route Once, as needed for Other (Blood sugar less than 70 and cannot eat) for up to 1 dose   . insulin glargine (LANTUS SOLOSTAR U-100 INSULIN) 100 unit/mL Subcutaneous Insulin Pen Take up to 90 units nightly as instructed   . insulin lispro (HUMALOG KWIKPEN INSULIN) 100 unit/mL Subcutaneous Insulin Pen Take up to 30 units three times a day as instructed      . Insulin Needles, Disposable, (PEN NEEDLE) 32 gauge x 5/32" Needle To inject 4 times per day   . lancets (ONE TOUCH DELICA) 33 gauge Misc To check sugars 4 times daily as directed   . lisinopril (PRINIVIL) 10 mg Oral Tablet Take 10 mg by mouth Once a day   . miconazole nitrate (LOTRIMIN AF) 2 % Powder Apply topically Twice daily   . multivit with iron,hematinic (SUPER B-COMPLEX ORAL) Take 1 Tab by mouth Once a day   . semaglutide (OZEMPIC) 1 mg/dose (2 mg/1.5 mL) Subcutaneous Pen Injector 1 mg by Subcutaneous route Every 7 days (Patient taking differently: 0.5 mg by Subcutaneous route Every 7 days )   . tiZANidine (ZANAFLEX) 4 mg Oral Tablet Take 4 mg by mouth Three times a day   . vits A,C,E/zinc/copper (VISION-VITE PRESERVE ORAL) Take 1 Tab by mouth Twice daily     No Known Allergies    PHYSICAL EXAM:   Vitals:    11/11/17 1428   BP: 130/76   Weight: 76.6 kg (168 lb 14 oz)   Height: 1.585 m (5' 2.4")   BMI: 30.55    General: Well appearing female in no acute distress.  Psychiatric: Normal mood and affect  Neuro: Alert and oriented x 3. Speech fluent. Cranial nerves II-XII intact. No focal deficits noted. Sensation intact  throughout.  HEENT: Head normocephalic, atraumatic. PERRLA, EOMI, conjunctiva clear. Oral mucosa pink and moist.   Thyroid/Neck: No lymphadenopathy or thyromegaly. No palpable nodules.   Lungs: Clear to auscultation bilaterally. Normal respiratory effort.   Cardiac: Regular rate and rhythm, normal S1 and S2, without murmurs, rubs, or gallops.  Abdomen: Soft, non-tender, non-distended. Some abdominal skin dimpling from injections.   Extremities: No edema, erythema, warmth, or tenderness.  Skin: Warm and dry. No visible rashes.      LABS:  Lab Results   Component Value Date/Time    HA1C 7.8 (H) 06/03/2017    SODIUM 138 06/03/2017    POTASSIUM 4.2 06/03/2017    CHLORIDE 100 06/03/2017    CO2 32 06/03/2017    BUN 14 06/03/2017    CREATININE 0.4 06/03/2017    MICALBCRERAT 22.6 12/17/2016 02:28 PM     AST 18 06/03/2017    ALT 23 06/03/2017     -Outside labs 05/2017: HgA1C 7.8%, creatinine 0.4, GFR >60, normal BMP    12/24/2016 1:28 PM - Edi, Reference Lab Results In      Component Results     Component Value Ref Range & Units Status    GAD65 AB ASSAY 0.12 (H) <=0.02 nmol/L Final     12/24/2016 1:30 PM - Edi, Reference Lab Results In      Component Results     Component Value Ref Range & Units Status    ISLET ANTIGEN 2 (IA-2) ANTIBODY, SERUM 0.00 <=0.02 nmol/L Final      Ref. Range 01/16/2017 10:04   INTACT PTH Latest Ref Range: 8.5 - 75.0 pg/mL 57.2   ALBUMIN Latest Ref Range: 3.4 - 4.8 g/dL 4.0     ASSESSMENT:  Isabella Terrell is a 62 y.o. female with PMH of HTN, HLD who presents for follow up of type I diabetes mellitus.     PLAN:  Type I Diabetes Mellitus vs LADA with element of Insulin Resistance with More Frequent Hyperglycemia  -Complicated by recent hyperglycemia, mild peripheral neuropathy, past microalbuminuria (now good), diabetic retinopathy and macrovascular diseases including HTN, HLD  -Anti-GAD and anti-islet cell antibodies to evaluate for type 1 DM checked and positive for GAD antibody  -Most recent HgA1C 7.8% in 05/2017, down from 12.6% in 10/2016; will repeat today. Urine microalbumin/creatinine ratio 22.6 in 11/2016, will repeat today; on lisinopril 10 mg qDaily  -Annual diabetes foot exam done 07/15/2017  -Most recent diabetic eye exam 02/2017. Has diabetic retinopathy requiring eye injections. Follows with Dr. Lamona Curl in [place], [place]  -Hypoglycemia not occurring. Has glucagon injection  -Reviewed blood sugars via Libre CGM download. AM sugars more controlled however having highs later in the day before lunch, before dinner, and at bedtime  -Continue Lantus 32 units qPM  -Will increase Humalog to 24 units with breakfast, 26 units with lunch, and 24 units with dinner  -Patient likely has element of insulin resistance as she requires higher doses of insulin. Since she never went into DKA, she  likely has some pancreatic beta cell function. Continue Ozempic 1 mg weekly. Will consider adding SGLT-2 inhibitor next appointment after sugars better controlled to further help with glucose control and help with weight loss  -Has glucometer, test strips, pen needles, lancets. Refilled [employer] supplies  -Most recent BMP 05/2017 with creatinine 0.40 and GFR >60  -Advised patient to check sugars 3-4x/day via [place] and to bring sugar logs and glucometer to clinic appointments     Hgba1c Goal <7%   Preprandial Glucose  Target 80-140 mg/dL   Peak Postprandial Glucose Target <180 mg/dL    BP Goal: <130/80    Labs today: HgA1C, BMP, urine microalbumin/Cr ratio   Vaccines:   Flu vaccine annually- discuss with PCP   Hepatitis B- discuss with PCP    Hepatitis B, 3-dose series to unvaccinated adults with DM>60 y/o    Hepatitis B, 3-dose series to unvaccinated adults with DM 108-59 y/o    Pneumonia- discuss with PCP    All patients with DM 2-64 y/o with pneumococcal polysaccharide vaccine (PPSV23)   At age 80 years, administer the pneumococcal conjugate vaccine (PCV 13) at least 1 year after vaccination with PPSV23, followed by another dose of vaccine PPSV23 at least 1 year after the last dose of PPSV23    Yearly eye and daily foot exams discussed   Symptoms of potential hypoglycemia discussed and treatment of hypoglycemia discussed   Return visit in 4 months or earlier if needed    The patient will be contacted once any lab work is resulted    CC: Notes to referring physician      Palpable Right Thyroid Nodule- Resolved  -TSH WNL. Thyroid ultrasound done 03/06/17 without thyroid nodules with homogenous thyroid texture    Mild Hypercalcemia- Resolved  -Noted on BMP from 12/17/16. Possibly secondary to dehydration as sugars were in the 460s at that time  -Repeat BMP, albumin, PTH in 12/2016 were normal. Corrected calcium in 9s on 06/03/2017    Orders Placed This Encounter   . Hemoglobin A1C   .  Microalbumin/Creatinine Ratio, Random Urine   . Basic Metabolic Panel   . insulin lispro (HUMALOG KWIKPEN INSULIN) 100 unit/mL Subcutaneous Insulin Pen   . flash glucose sensor (FREESTYLE LIBRE 14 DAY SENSOR) Does not apply Kit     Keith Rake, MD, MPH 11/11/2017, 15:47        I saw and examined the patient.  I reviewed the endocrine fellow's note.  I agree with the findings and plan of care as documented in the fellow note.  Any exceptions/additions are edited/noted.      Rose Phi, MD   Assistant Professor    Endocrinology and [facility]  [phone]

## 2017-11-10 ENCOUNTER — Ambulatory Visit (INDEPENDENT_AMBULATORY_CARE_PROVIDER_SITE_OTHER): Payer: Self-pay | Admitting: Neurology

## 2017-11-11 ENCOUNTER — Ambulatory Visit (HOSPITAL_BASED_OUTPATIENT_CLINIC_OR_DEPARTMENT_OTHER): Payer: No Typology Code available for payment source

## 2017-11-11 ENCOUNTER — Encounter (HOSPITAL_BASED_OUTPATIENT_CLINIC_OR_DEPARTMENT_OTHER): Payer: Self-pay | Admitting: "Endocrinology

## 2017-11-11 ENCOUNTER — Ambulatory Visit
Payer: No Typology Code available for payment source | Attending: INTERNAL MEDICINE-ENDOCRINOLOGY-DIABETES AND METABOLISM | Admitting: "Endocrinology

## 2017-11-11 VITALS — BP 130/76 | Ht 62.4 in | Wt 168.9 lb

## 2017-11-11 DIAGNOSIS — E10649 Type 1 diabetes mellitus with hypoglycemia without coma: Secondary | ICD-10-CM | POA: Insufficient documentation

## 2017-11-11 DIAGNOSIS — I10 Essential (primary) hypertension: Secondary | ICD-10-CM

## 2017-11-11 DIAGNOSIS — E109 Type 1 diabetes mellitus without complications: Secondary | ICD-10-CM

## 2017-11-11 DIAGNOSIS — E785 Hyperlipidemia, unspecified: Secondary | ICD-10-CM

## 2017-11-11 LAB — BASIC METABOLIC PANEL
ANION GAP: 7 mmol/L (ref 4–13)
BUN/CREA RATIO: 19 (ref 6–22)
BUN: 14 mg/dL (ref 8–25)
CALCIUM: 9.7 mg/dL (ref 8.5–10.2)
CHLORIDE: 103 mmol/L (ref 96–111)
CO2 TOTAL: 31 mmol/L (ref 22–32)
CREATININE: 0.74 mg/dL (ref 0.49–1.10)
ESTIMATED GFR: >59 mL/min/{1.73_m2} (ref >59)
GLUCOSE: 252 mg/dL — ABNORMAL HIGH (ref 65–139)
POTASSIUM: 3.8 mmol/L (ref 3.5–5.1)
SODIUM: 141 mmol/L (ref 136–145)

## 2017-11-11 MED ORDER — INSULIN LISPRO (U-100) 100 UNIT/ML SUBCUTANEOUS PEN
PEN_INJECTOR | SUBCUTANEOUS | 3 refills | Status: DC
Start: 2017-11-11 — End: 2018-03-31

## 2017-11-11 MED ORDER — FLASH GLUCOSE SENSOR KIT: Each | 3 refills | 0 days | Status: AC

## 2017-11-11 NOTE — Patient Instructions (Signed)
Since your morning blood sugars are good, you can continue Lantus 32 units nightly.  For your mealtime sugars, you should increased your breakfast Novolog to 24 units, lunch time Novolog to 26 units, and dinner Novolog to 24 units.   Continue Ozempic 1 mg weekly.

## 2017-11-12 LAB — HGA1C (HEMOGLOBIN A1C WITH EST AVG GLUCOSE)
ESTIMATED AVERAGE GLUCOSE: 163 mg/dL
HEMOGLOBIN A1C: 7.3 % — ABNORMAL HIGH (ref 4.0–5.6)

## 2017-11-13 ENCOUNTER — Telehealth (HOSPITAL_BASED_OUTPATIENT_CLINIC_OR_DEPARTMENT_OTHER): Payer: Self-pay | Admitting: "Endocrinology

## 2017-11-13 NOTE — Telephone Encounter (Signed)
Regarding: test results  ----- Message from Michel Bickersheryl Brison sent at 11/13/2017  2:38 PM EDT -----  Zadie RhineBarghouthi, Nadia T, MD    Patient is requesting her A1C results.

## 2017-11-13 NOTE — Telephone Encounter (Signed)
Spoke with patient regarding provider's below message, patient verbalized understanding. Righteous Claiborne, RN  11/13/2017, 15:21

## 2017-11-13 NOTE — Telephone Encounter (Signed)
-----   Message from Zadie RhineNadia T [name], MD sent at 11/12/2017  7:19 AM EDT -----  Mychart message sent:    Delories HeinzHi Kasy,    Your hemoglobin A1C continues to improve and is now at 7.3% (it was 7.8% five months ago and 10.9% eight months ago). You are doing great. Your kidney function and electrolytes are all normal.     Sincerely,    Gordy LevanNadia [name], MD, MPH 11/12/2017, 07:18

## 2017-11-19 ENCOUNTER — Ambulatory Visit (HOSPITAL_BASED_OUTPATIENT_CLINIC_OR_DEPARTMENT_OTHER): Payer: Self-pay | Admitting: "Endocrinology

## 2017-11-19 NOTE — Telephone Encounter (Signed)
Obtained blood sugar readings from patient. She stated she did not take her Lantus at bedtime on 11/16/17 and 11/17/17 because her sugars were right around 100 and she stated when she takes her Lantus her sugar drops 40 points and does not want to drop in the middle of the night. She stated she takes Humalog 20-25 units tid and Lantus 30-32 units at bedtime. Gave to Margaretmary DysAshley L and Justin MendSheree for review. Tanice Petre, RN  11/19/2017, 10:37

## 2017-11-19 NOTE — Telephone Encounter (Signed)
-----   Message from Rexene AgentSusie Shrout sent at 11/19/2017  9:33 AM EDT -----  Zadie RhineBarghouthi, Nadia T, MD  Pt is calling needing to speak with you about her night time insulin.Please call pt back at [phone]

## 2017-11-21 ENCOUNTER — Telehealth (HOSPITAL_BASED_OUTPATIENT_CLINIC_OR_DEPARTMENT_OTHER): Payer: Self-pay | Admitting: "Endocrinology

## 2017-11-21 NOTE — Telephone Encounter (Signed)
Received unread MyChart message notification, called patient to review message from Dr Linward NatalN [name], patient verbalized understanding. Patient stated that she has had issues recently with the Freestyle Libre CGM "popping off" her arm and that she has paid out of pocket to replace this instead of going through insurance but is questioning how she should get another sensor since her most recent one has come off as well. Patient questioning if she should make insulin adjusts in the evening due to glucose readings of 90's, patient could not tell me an average glucose reading. Discussed with A Liller RN in clinic who stated she would review this and contact her to discuss, patient was agreeable to this. Ennis FortsKristen M Hari Casaus, RN  11/21/2017, 12:10

## 2017-11-21 NOTE — Telephone Encounter (Signed)
Returned call to patient, left voicemail to return call to clinic.     ((Fasting blood sugars have been 130-170. Blood sugars do not reflect any lows. Long acting would not drop am numbers. Reviewed with Isabella Terrell, advised if she is having these symptoms, can decrease Lantus to 25u and humalog to 20/20/20.))

## 2017-11-21 NOTE — Telephone Encounter (Signed)
-----   Message from Cathren HarshBarbara M. Halperin sent at 11/20/2017  3:31 AM EDT -----  Regarding: Message from Dr Drusilla KannerBarghouthi  Delories HeinzHi Lexia,     Your hemoglobin A1C continues to improve and is now at 7.3% (it was 7.8% five months ago and 10.9% eight months ago). You are doing great. Your kidney function and electrolytes are all normal.     Sincerely,     Gordy LevanNadia [name], MD, MPH 11/12/2017, 07:18

## 2017-12-25 ENCOUNTER — Ambulatory Visit (HOSPITAL_BASED_OUTPATIENT_CLINIC_OR_DEPARTMENT_OTHER): Payer: Self-pay | Admitting: "Endocrinology

## 2017-12-25 NOTE — Telephone Encounter (Signed)
Spoke with Ty at [facility] who stated patient has 1 refills left on ozempic 1 mg Rx, spoke with patient who verbalized understanding. Wendell Fiebig, RN  12/25/2017, 09:37

## 2017-12-25 NOTE — Telephone Encounter (Signed)
Regarding: pt out of rx  ----- Message from Katrinka Blazing sent at 12/25/2017  9:25 AM EDT -----  Doctor Name:   Zadie Rhine, MD      Date of last appointment: 8.20.19    Next scheduled visit: 12.10.19    Medication Requested:     semaglutide (OZEMPIC) 1 mg/dose (2 mg/1.5 mL) Subcutaneous Pen Injector    Preferred Pharmacy     [id number] [place], [place] - [address]    [address] [place] [address]    Phone: [phone] Fax: [phone]    Not a 24 hour pharmacy; exact hours not known.

## 2018-02-27 NOTE — Progress Notes (Signed)
[facility]  RETURN VISIT    Chief Complaint:  Type I Diabetes Mellitus   Referring Provider: Marlene Lard, APRN-FNP-BC  PCP: Marlene Lard, APRN-FNP-BC    HPI:   Isabella Terrell is a 62 y.o. female with PMH of HTN, HLD who presents for follow up of type I diabetes mellitus.     Past history:  -Diagnosed with type II diabetes mellitus about 25 years ago after presenting to her PCP with polyuria  -Has never been hospitalized for high or low sugars  -Was having multiple UTI and yeast infections so her gynecologist checked her HgA1C on 11/21/16 which was elevated to 12.6%  -When established here, GAD and islet cell antibodies were checked to evaluate for type I DM with GAD antibodies coming back positive  -Previously on Januvia 100 mg qDaily and metformin 1000 mg BID discontinued after type I diagnosis  -Now on Lantus 36 units qPM (was previously up to 72 units nightly) and Novolog around 22-28 units TID-AC in addition to Ozempic 1 mg weekly as patient has some element of insulin resistance    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 She checks her sugars 4 times a day. AM sugars are good at 100-160s, pre-lunch are 140-250s, pre-dinner are 150-350s, and before bedtime are 110-240s but usually less than 200. Patient states that she does not take her Lantus sometimes when her bedtime sugars are in the 100s. She is currently taking her Humalog about 30 minutes after meals rather than before meals. Her biggest concern is that she has continued to gain weight. She has gained about 20 pounds since being started on insulin. She states that she skips breakfast about 5 days a week and does not take insulin if she skips a meal. She works as a Education officer, museum and states that her lunches vary in terms of what she eats but often consist largely of vegetables. She does snack throughout the day and states that she cooks healthy meals for dinner. Her husband is here with her today and states that he has lost 30 pounds  with Ozempic and watching his diet. Patient states that she does not count calories but tries to eat foods that are lower in carbs. She has never been on any weight loss medications. She has no history of CAD and denies frequent headaches. She reports feeling better when she was doing physical therapy for her back and states she now walks about 8000 steps a day which is down from the few miles she used to walk. She reports having a low blood sugar about 3 times a week usually in the middle of the night with sugars in 70s with one at 59.       DIABETIC COMPLICATIONS:  Microvascular Complications:    Peripheral/Autonomic Neuropathy: +Mild peripheral neuropathy, not currently on medication   Renal: Last GFR >59 10/2017, Microalb/crt ratio 22.6 on 12/17/16. Currently on lisinopril 10 mg qDaily   Retinopathy: Last eye exam 02/2018. Has diabetic retinopathy requiring eye injections. Follows with Dr. Lamona Curl in [place], [place]  Macrovascular Complications:   Hx of CAD: No   Hx of statin/ASA: On Lipitor, no aspirin; SGLT2 or GLP-1 therapy? No SGLT-2; currently on Ozempic   Hx of Stroke: No   Hx of HTN: Yes   Hx of HLD: Yes     CURRENT ANTI-DIABETIC REGIMEN:  Patient is adherent to meds   Lantus 36 units qPM   Humalog 22-28 units TID-AC   Ozempic 1 mg weekly  PREVIOUS ANTI-DIABETIC MEDS:   Januvia- D/Ced when diagnosed with type I DM   Metformin- D/Ced when diagnosed with type I DM     BLOOD GLUCOSE TRENDS: Testing BG 4 times per day   Time Readings   Before Breakfast 100-160s   Before Lunch 140-250s   Before Supper 150-350s   Bedtime  110-240s      HYPOGLYCEMIA EVALUATION:  Is it occurring? Symptoms? About 3x/week; symptoms include feeling strange   Hypoglycemic Event: (Date/Time) Usually in the middle of the night of 59-70s   Treatment: Food, juice   Assisted or Unassisted: Unassisted   Hypoglycemic unawareness? No; feels weird when sugars in 70s        REVIEW OF SYSTEMS:  In addition to HPI...  Constitutional:  negative  Eyes: negative  Ears, nose, mouth, throat, and face: negative  Respiratory: negative  Cardiovascular: negative  Gastrointestinal: negative  Genitourinary:negative  Integument/breast: negative  Hematologic/lymphatic: negative  Musculoskeletal:negative  Neurological: negative  Behavioral/Psych: negative  Endocrine: negative  Allergic/Immunologic: negative  All other ROS Negative    Past Medical History:   Diagnosis Date   . Diabetic retinopathy (CMS [place])    . HLD (hyperlipidemia)    . HTN (hypertension)    . Type I diabetes mellitus (CMS [facility])      Past Surgical History:   Procedure Laterality Date   . ENDOMETRIAL ABLATION     . HX TONSILLECTOMY     . HX WISDOM TEETH EXTRACTION     . TRIGGER FINGER RELEASE       Social History     Tobacco Use   . Smoking status: Never Smoker   . Smokeless tobacco: Never Used   Substance Use Topics   . Alcohol use: No   . Drug use: No        Family Medical History:     Problem Relation (Age of Onset)    Coronary Artery Disease Mother    Diabetes Father    Diabetes type II Father, Brother, Paternal Uncle, Sister    Hypothyroidism Mother, Sister    Multiple Sclerosis Brother    Other Mother, Father        Current Outpatient Medications   Medication Sig   . aspirin (ECOTRIN) 81 mg Oral Tablet, Delayed Release (E.C.) Take 81 mg by mouth Once a day   . atorvastatin (LIPITOR) 20 mg Oral Tablet Take 1 Tab (20 mg total) by mouth Once a day   . Blood Sugar Diagnostic (ONETOUCH VERIO) Strip To check sugars 4 times daily as directed   . cholecalciferol, Vitamin D3, (VITAMIN D-3) 5,000 unit Oral Tablet Take 5,000 Units by mouth Once a day   . clotrimazole (LOTRIMIN AF, CLOTRIMAZOLE,) 1 % Cream Apply topically Twice daily   . diazePAM (VALIUM) 5 mg Oral Tablet Valium 5 mg by mouth . Take 45 minutes prior to MRI.   . flash glucose scanning reader (FREESTYLE LIBRE 14 DAY READER) Does not apply Misc To test blood sugars E10.9   . flash glucose sensor (FREESTYLE LIBRE 14 DAY SENSOR) Does not  apply Kit To test blood sugars E10.9   . glucagon (GLUCAGEN) 1 mg/mL Injection Recon Soln 1 mg by Intramuscular route Once, as needed for Other (Blood sugar less than 70 and cannot eat) for up to 1 dose   . insulin glargine (LANTUS SOLOSTAR U-100 INSULIN) 100 unit/mL Subcutaneous Insulin Pen Take up to 90 units nightly as instructed   . insulin lispro (HUMALOG KWIKPEN INSULIN) 100 unit/mL Subcutaneous  Insulin Pen Take up to 30 units three times a day as instructed   . Insulin Needles, Disposable, (PEN NEEDLE) 32 gauge x 5/32" Needle To inject 4 times per day   . lancets (ONE TOUCH DELICA) 33 gauge Misc To check sugars 4 times daily as directed   . lisinopril (PRINIVIL) 10 mg Oral Tablet Take 10 mg by mouth Once a day   . miconazole nitrate (LOTRIMIN AF) 2 % Powder Apply topically Twice daily   . multivit with iron,hematinic (SUPER B-COMPLEX ORAL) Take 1 Tab by mouth Once a day   . Phentermine 15 mg Oral Capsule Take 1 Cap (15 mg total) by mouth Once a day   . semaglutide (OZEMPIC) 1 mg/dose (2 mg/1.5 mL) Subcutaneous Pen Injector 1 mg by Subcutaneous route Every 7 days (Patient taking differently: 0.5 mg by Subcutaneous route Every 7 days )   . tiZANidine (ZANAFLEX) 4 mg Oral Tablet Take 4 mg by mouth Three times a day   . vits A,C,E/zinc/copper (VISION-VITE PRESERVE ORAL) Take 1 Tab by mouth Twice daily     No Known Allergies    PHYSICAL EXAM:   Vitals:    03/03/18 1134   BP: 130/80   Pulse: 91   SpO2: 97%   Weight: 80.1 kg (176 lb 9.4 oz)   Height: 1.592 m (5' 2.68")   BMI: 31.67   General: Well appearing female in no acute distress.  Psychiatric: Normal mood and affect  Neuro: Alert and oriented x 3. Speech fluent. Cranial nerves II-XII intact. No focal deficits noted. Sensation intact throughout.  HEENT: Head normocephalic, atraumatic. PERRLA, EOMI, conjunctiva clear. Oral mucosa pink and moist.   Thyroid/Neck: No lymphadenopathy or thyromegaly. No palpable nodules.   Lungs: Clear to auscultation bilaterally.  Normal respiratory effort.   Cardiac: Regular rate and rhythm, normal S1 and S2, without murmurs, rubs, or gallops.  Abdomen: Soft, non-tender, non-distended. Some abdominal skin dimpling from injections.   Extremities: No edema, erythema, warmth, or tenderness.  Skin: Warm and dry. No visible rashes.  Diabetic foot exam:  No edema bilaterally. No ulcerations or lesions bilaterally. Sensation normal bilaterally.        LABS:  Lab Results   Component Value Date/Time    HA1C 7.3 (H) 11/11/2017 03:17 PM    HA1C 7.8 (H) 06/03/2017    SODIUM 141 11/11/2017 03:17 PM    SODIUM 138 06/03/2017    POTASSIUM 3.8 11/11/2017 03:17 PM    POTASSIUM 4.2 06/03/2017    CHLORIDE 103 11/11/2017 03:17 PM    CHLORIDE 100 06/03/2017    CO2 31 11/11/2017 03:17 PM    CO2 32 06/03/2017    BUN 14 11/11/2017 03:17 PM    BUN 14 06/03/2017    CREATININE 0.74 11/11/2017 03:17 PM    CREATININE 0.4 06/03/2017    MICALBCRERAT 22.6 12/17/2016 02:28 PM    AST 18 06/03/2017    ALT 23 06/03/2017     -Outside labs 05/2017: HgA1C 7.8%, creatinine 0.4, GFR >60, normal BMP    12/24/2016 1:28 PM - Edi, Reference Lab Results In      Component Results     Component Value Ref Range & Units Status    GAD65 AB ASSAY 0.12 (H) <=0.02 nmol/L Final     12/24/2016 1:30 PM - Edi, Reference Lab Results In      Component Results     Component Value Ref Range & Units Status    ISLET ANTIGEN 2 (IA-2) ANTIBODY, SERUM 0.00 <=0.02  nmol/L Final      Ref. Range 01/16/2017 10:04   INTACT PTH Latest Ref Range: 8.5 - 75.0 pg/mL 57.2   ALBUMIN Latest Ref Range: 3.4 - 4.8 g/dL 4.0     ASSESSMENT:  PHILANA YOUNIS is a 62 y.o. female with PMH of HTN, HLD who presents for follow up of type I diabetes mellitus.     PLAN:  Uncontrolled Type I Diabetes Mellitus (LADA) with element of Insulin Resistance with Frequent Hyperglycemia and Occasional, Late Night Hypoglycemia  -Complicated by more frequent hyperglycemia, occasional late night hypoglycemia, mild peripheral neuropathy, past  microalbuminuria (now good), diabetic retinopathy and macrovascular diseases including HTN, HLD  -Anti-GAD and anti-islet cell antibodies to evaluate for type 1 DM checked and positive for GAD antibody  -Most recent HgA1C 7.3% in 10/2017, down from 12.6% in 10/2016; will repeat today. Urine microalbumin/creatinine ratio 22.6 in 11/2016, will repeat today; on lisinopril 10 mg qDaily  -Annual diabetes foot exam done today, 03/03/2018  -Most recent diabetic eye exam 02/2018. Has diabetic retinopathy requiring eye injections. Follows with Dr. Lamona Curl in [place], [place] monthly  -Hypoglycemia occurring about 2-3x/week mostly late at night. Has glucagon injection. Treats with food, juice  -Reviewed blood sugars via Libre CGM download. AM sugars are controlled however pre-meal and bedtime sugars are mostly high  -Continue Lantus 36 units qPM  -Discussed with patient that she is taking Humalog incorrectly. She is currently taking it 30 minutes after meals which is not controlling her blood sugar increase from her food and then leading to post-meal hypoglycemia   -Instructed patient to take the same doses of Humalog 22-28 units TID-AC but to start taking it 15 minutes prior to meals. Instructed her that if her bedtime sugar is low, to still take her Lantus but to eat a bedtime snack like peanut butter crackers  -Patient likely has element of insulin resistance as she requires higher doses of insulin. Since she never went into DKA, she likely has some pancreatic beta cell function. Continue Ozempic 1 mg weekly. Blood sugars too uncontrolled at this time to attempt to add an SGLT-2 inhibitor  -Has glucometer, test strips, pen needles, lancets, Freestyle Libre  -Most recent BMP 10/2017 with creatinine 0.74 and GFR >59  -Advised patient to check sugars 3-4x/day via FreeStyle Libre and to bring sugar logs and glucometer to clinic appointments     Hgba1c Goal <7%   Preprandial Glucose Target 80-140 mg/dL   Peak Postprandial Glucose  Target <180 mg/dL    BP Goal: <130/80    Labs today: HgA1C, BMP, urine microalbumin/Cr ratio   Vaccines:   Flu vaccine annually- discuss with PCP   Hepatitis B- discuss with PCP    Hepatitis B, 3-dose series to unvaccinated adults with DM>60 y/o    Hepatitis B, 3-dose series to unvaccinated adults with DM 34-59 y/o    Pneumonia- discuss with PCP    All patients with DM 2-64 y/o with pneumococcal polysaccharide vaccine (PPSV23)   At age 80 years, administer the pneumococcal conjugate vaccine (PCV 13) at least 1 year after vaccination with PPSV23, followed by another dose of vaccine PPSV23 at least 1 year after the last dose of PPSV23    Yearly eye and daily foot exams discussed   Symptoms of potential hypoglycemia discussed and treatment of hypoglycemia discussed   Return visit in 6 months or earlier if needed    The patient will be contacted once any lab work is resulted    CC: Notes  to referring physician      Palpable Right Thyroid Nodule- Resolved  -TSH WNL. Thyroid ultrasound done 03/06/17 without thyroid nodules with homogenous thyroid texture    Difficulty Losing Weight  -Discussed calorie counting apps with patient such as LoseIt or MyFitnessPal. Instructed patient to just input their usual calories the first week then to try to follow the calorie budget starting the second week.   -Discussed that would not recommend intermittent fasting for patients with type 1 DM as it can lead to uncontrolled sugars  -Discussed low carb, high protein diet like keto diet which would be an option if patient would like to try  -Discussed different weight loss medications including GLP-1 agonists (Victoza, Ozempic, or Saxenda), Phentermine, Topamax  -Patient is already on full dose Ozempic without weight loss  -Will start phentermine. Discussed that phentermine can worsen HTN, anxiety, or palpitations. She has no history of CAD or anxiety. Instructed patient to immediately stop the medication if she develops  shortness of breath, chest pain, or persistent heart palpitations. If she does well with the medication, then will try for 3 months. If no weight loss, will discontinue and consider trying Orlistat or Belviq  -Counseled patient on trying to incorporate exercise into their daily routine such as walking, fitness classes, or starting with low-weight exercise machines or free weights.  -If weight gain continues to be a problem, will discuss bariatric surgery referral in future if needed  -Will check TSH to evaluate for hypothyroidism    Orders Placed This Encounter   . Hemoglobin A1C   . Basic Metabolic Panel   . MICROALBUMIN/CREATININE RATIO, URINE, RANDOM   . THYROID STIMULATING HORMONE (SENSITIVE TSH)   . Phentermine 15 mg Oral Capsule     Keith Rake, MD, MPH 03/03/2018, 13:05          I saw and examined the patient.  I reviewed the fellow's note.  I agree with the findings and plan of care as documented in the fellow's note.  Any exceptions/additions are edited/noted.    Isabella Larsson, MD

## 2018-03-03 ENCOUNTER — Ambulatory Visit (HOSPITAL_BASED_OUTPATIENT_CLINIC_OR_DEPARTMENT_OTHER): Payer: No Typology Code available for payment source

## 2018-03-03 ENCOUNTER — Encounter (HOSPITAL_BASED_OUTPATIENT_CLINIC_OR_DEPARTMENT_OTHER): Payer: Self-pay | Admitting: "Endocrinology

## 2018-03-03 ENCOUNTER — Ambulatory Visit
Payer: No Typology Code available for payment source | Attending: INTERNAL MEDICINE-ENDOCRINOLOGY-DIABETES AND METABOLISM | Admitting: "Endocrinology

## 2018-03-03 VITALS — BP 130/80 | HR 91 | Ht 62.68 in | Wt 176.6 lb

## 2018-03-03 DIAGNOSIS — E10319 Type 1 diabetes mellitus with unspecified diabetic retinopathy without macular edema: Secondary | ICD-10-CM | POA: Insufficient documentation

## 2018-03-03 DIAGNOSIS — E1065 Type 1 diabetes mellitus with hyperglycemia: Secondary | ICD-10-CM | POA: Insufficient documentation

## 2018-03-03 DIAGNOSIS — R638 Other symptoms and signs concerning food and fluid intake: Secondary | ICD-10-CM

## 2018-03-03 DIAGNOSIS — I10 Essential (primary) hypertension: Secondary | ICD-10-CM | POA: Insufficient documentation

## 2018-03-03 DIAGNOSIS — E8881 Metabolic syndrome: Secondary | ICD-10-CM | POA: Insufficient documentation

## 2018-03-03 DIAGNOSIS — E109 Type 1 diabetes mellitus without complications: Secondary | ICD-10-CM

## 2018-03-03 DIAGNOSIS — E1042 Type 1 diabetes mellitus with diabetic polyneuropathy: Secondary | ICD-10-CM | POA: Insufficient documentation

## 2018-03-03 DIAGNOSIS — E785 Hyperlipidemia, unspecified: Secondary | ICD-10-CM | POA: Insufficient documentation

## 2018-03-03 DIAGNOSIS — Z794 Long term (current) use of insulin: Secondary | ICD-10-CM | POA: Insufficient documentation

## 2018-03-03 DIAGNOSIS — Z79899 Other long term (current) drug therapy: Secondary | ICD-10-CM | POA: Insufficient documentation

## 2018-03-03 DIAGNOSIS — E10649 Type 1 diabetes mellitus with hypoglycemia without coma: Secondary | ICD-10-CM | POA: Insufficient documentation

## 2018-03-03 LAB — BASIC METABOLIC PANEL
ANION GAP: 7 mmol/L (ref 4–13)
BUN/CREA RATIO: 17 (ref 6–22)
BUN: 12 mg/dL (ref 8–25)
CALCIUM: 9.6 mg/dL (ref 8.5–10.2)
CHLORIDE: 102 mmol/L (ref 96–111)
CO2 TOTAL: 29 mmol/L (ref 22–32)
CREATININE: 0.72 mg/dL (ref 0.49–1.10)
ESTIMATED GFR: >60 mL/min/{1.73_m2} (ref >60)
GLUCOSE: 202 mg/dL — ABNORMAL HIGH (ref 65–139)
POTASSIUM: 4.1 mmol/L (ref 3.5–5.1)
SODIUM: 138 mmol/L (ref 136–145)

## 2018-03-03 LAB — THYROID STIMULATING HORMONE (SENSITIVE TSH): TSH: 1.247 u[IU]/mL (ref 0.350–5.000)

## 2018-03-03 MED ORDER — PHENTERMINE 15 MG CAPSULE
15.0000 mg | ORAL_CAPSULE | Freq: Every day | ORAL | 0 refills | Status: DC
Start: 2018-03-03 — End: 2018-04-01

## 2018-03-03 NOTE — Patient Instructions (Addendum)
Since your morning sugars are good, you should continue taking Lantus 36 units nightly.  For your mealtime sugars, you will need to adjust your Humalog to taking it 15 minute BEFORE your meals so that it can fully cover your sugar increases from food and not lead to post-meal low blood sugars.   You should continue taking 22-28 units Humalog three times a day with meals. If you skip a meal, do not take the Humalog for that meal.   We will start you on a weight loss medication which is an appetite suppressant called phentermine at a low dose 15 mg daily. If you experience any sort of chest pain, anxiety, heart fluttering/palpitations then you should stop the medication immediately

## 2018-03-04 ENCOUNTER — Telehealth (HOSPITAL_BASED_OUTPATIENT_CLINIC_OR_DEPARTMENT_OTHER): Payer: Self-pay | Admitting: "Endocrinology

## 2018-03-04 LAB — HGA1C (HEMOGLOBIN A1C WITH EST AVG GLUCOSE)
ESTIMATED AVERAGE GLUCOSE: 186 mg/dL
HEMOGLOBIN A1C: 8.1 % — ABNORMAL HIGH (ref 4.0–5.6)

## 2018-03-04 NOTE — Telephone Encounter (Signed)
-----   Message from Zadie RhineNadia T [name], MD sent at 03/04/2018  9:58 AM EST -----  Mychart message sent:    Delories HeinzHi Monay,    Your hemoglobin A1C is 8.1% which isn't too bad. Just make the changes we discussed yesterday with taking your Humalog before your meals instead of after and I think that will help with the high sugars you have been having. Your kidney function, electrolytes, and thyroid level all look great.     Sincerely,    Gordy LevanNadia [name], MD, MPH 03/04/2018, 09:58

## 2018-03-04 NOTE — Telephone Encounter (Signed)
Attempted patient contact regarding provider's below message, left message to call clinic back. Cortney Dewitt, RN  03/04/2018, 16:15

## 2018-03-05 NOTE — Telephone Encounter (Signed)
Left voicemail regarding lab results per patient request. Left clinic phone number to call back if she had any additional questions. Tami RibasAshley N Martin, RN  03/05/2018, 14:28

## 2018-03-05 NOTE — Telephone Encounter (Signed)
-----   Message from Rexene AgentSusie Shrout sent at 03/05/2018  1:23 PM EST -----  Zadie RhineBarghouthi, Nadia T, MD  Pt is calling needing her a1c results.Please call pt back at [phone] she states that if she doesn't answer its okay to leave a message on the machine.

## 2018-03-10 ENCOUNTER — Ambulatory Visit (HOSPITAL_BASED_OUTPATIENT_CLINIC_OR_DEPARTMENT_OTHER): Payer: Self-pay | Admitting: "Endocrinology

## 2018-03-10 NOTE — Telephone Encounter (Signed)
Attempted patient contact regarding below message. Provider wrote 90 day supply script for below medication to pharmacy on 03/03/18. Left voicemail to call clinic back. Tami RibasAshley N Martin, RN  03/10/2018, 14:52

## 2018-03-10 NOTE — Telephone Encounter (Signed)
Regarding: Refill request  ----- Message from Katrinka BlazingJamie R Rhodes sent at 03/10/2018  2:32 PM EST -----  Doctor Name: Zadie RhineBarghouthi, Nadia T, MD        Date of last appointment: 12.10.19    Next scheduled visit: 6.16.20    Medication Requested:     Phentermine 15 mg Oral Capsule    Preferred Pharmacy     [id number] [place], [place] - [address]   [address]    [address] [place] [id number]    Phone: [phone] Fax: [phone]    Not a 24 hour pharmacy; exact hours not known.

## 2018-03-20 ENCOUNTER — Ambulatory Visit (HOSPITAL_BASED_OUTPATIENT_CLINIC_OR_DEPARTMENT_OTHER): Payer: Self-pay | Admitting: "Endocrinology

## 2018-03-20 NOTE — Telephone Encounter (Signed)
RE: Phentermine 15 mg  Medication has been APPROVED for 30 days.  Called the pharmacy and it ran through with a $8.51 co-pay.  Patient notified via voicemail.  Rebaellen Antionette Denman GeorgeGoff, LPN  16/10/960412/27/2019, 14:11

## 2018-03-26 ENCOUNTER — Ambulatory Visit (HOSPITAL_BASED_OUTPATIENT_CLINIC_OR_DEPARTMENT_OTHER): Payer: Self-pay | Admitting: "Endocrinology

## 2018-03-26 NOTE — Telephone Encounter (Signed)
Attempted patient contact regarding below message to see exactly what medication patient was needing switched. Left voicemail to call clinic back. Tami Ribas, RN  03/26/2018, 15:00

## 2018-03-26 NOTE — Telephone Encounter (Signed)
Regarding: Rx issue  ----- Message from Sander Nephew sent at 03/26/2018  2:47 PM EST -----  Zadie Rhine, MD    The pt states she needs the Rx that is a controlled substance [facility] [place] and [facility] [place]  (she does not know name of Rx)    Please call pt when completed. Leave message when done.        Preferred Pharmacy     [id number] [place], [place] - [address]   [address]    [address] [place] [address]    Phone: [phone] Fax: [phone]        [id number] [address]    [address] [place] [address]    Phone: [phone] Fax: [phone]    Not a 24 hour pharmacy; exact hours not known.

## 2018-03-31 ENCOUNTER — Other Ambulatory Visit (HOSPITAL_BASED_OUTPATIENT_CLINIC_OR_DEPARTMENT_OTHER): Payer: Self-pay | Admitting: "Endocrinology

## 2018-03-31 DIAGNOSIS — E109 Type 1 diabetes mellitus without complications: Secondary | ICD-10-CM

## 2018-03-31 DIAGNOSIS — E119 Type 2 diabetes mellitus without complications: Secondary | ICD-10-CM

## 2018-03-31 DIAGNOSIS — R638 Other symptoms and signs concerning food and fluid intake: Secondary | ICD-10-CM

## 2018-03-31 MED ORDER — INSULIN LISPRO (U-100) 100 UNIT/ML SUBCUTANEOUS PEN
PEN_INJECTOR | SUBCUTANEOUS | 3 refills | Status: DC
Start: 2018-03-31 — End: 2019-08-10

## 2018-03-31 MED ORDER — INSULIN GLARGINE (U-100) 100 UNIT/ML (3 ML) SUBCUTANEOUS PEN
PEN_INJECTOR | SUBCUTANEOUS | 3 refills | Status: DC
Start: 2018-03-31 — End: 2019-09-09

## 2018-03-31 MED ORDER — SEMAGLUTIDE 1 MG/DOSE (2 MG/1.5 ML) SUBCUTANEOUS PEN INJECTOR: 1 mg | Syringe | SUBCUTANEOUS | 3 refills | 0 days | Status: AC

## 2018-03-31 NOTE — Telephone Encounter (Signed)
Current Outpatient Medications   Medication Sig   . aspirin (ECOTRIN) 81 mg Oral Tablet, Delayed Release (E.C.) Take 81 mg by mouth Once a day   . atorvastatin (LIPITOR) 20 mg Oral Tablet Take 1 Tab (20 mg total) by mouth Once a day   . Blood Sugar Diagnostic (ONETOUCH VERIO) Strip To check sugars 4 times daily as directed   . cholecalciferol, Vitamin D3, (VITAMIN D-3) 5,000 unit Oral Tablet Take 5,000 Units by mouth Once a day   . clotrimazole (LOTRIMIN AF, CLOTRIMAZOLE,) 1 % Cream Apply topically Twice daily   . diazePAM (VALIUM) 5 mg Oral Tablet Valium 5 mg by mouth . Take 45 minutes prior to MRI.   . flash glucose scanning reader (FREESTYLE LIBRE 14 DAY READER) Does not apply Misc To test blood sugars E10.9   . flash glucose sensor (FREESTYLE LIBRE 14 DAY SENSOR) Does not apply Kit To test blood sugars E10.9   . glucagon (GLUCAGEN) 1 mg/mL Injection Recon Soln 1 mg by Intramuscular route Once, as needed for Other (Blood sugar less than 70 and cannot eat) for up to 1 dose   . insulin glargine (LANTUS SOLOSTAR U-100 INSULIN) 100 unit/mL Subcutaneous Insulin Pen Take up to 90 units nightly as instructed   . insulin lispro (HUMALOG KWIKPEN INSULIN) 100 unit/mL Subcutaneous Insulin Pen Take up to 30 units three times a day as instructed   . Insulin Needles, Disposable, (PEN NEEDLE) 32 gauge x 5/32" Needle To inject 4 times per day   . lancets (ONE TOUCH DELICA) 33 gauge Misc To check sugars 4 times daily as directed   . lisinopril (PRINIVIL) 10 mg Oral Tablet Take 10 mg by mouth Once a day   . miconazole nitrate (LOTRIMIN AF) 2 % Powder Apply topically Twice daily   . multivit with iron,hematinic (SUPER B-COMPLEX ORAL) Take 1 Tab by mouth Once a day   . Phentermine 15 mg Oral Capsule Take 1 Cap (15 mg total) by mouth Once a day   . semaglutide (OZEMPIC) 1 mg/dose (2 mg/1.5 mL) Subcutaneous Pen Injector 1 mg by Subcutaneous route Every 7 days (Patient taking differently: 0.5 mg by Subcutaneous route Every 7 days )   .  tiZANidine (ZANAFLEX) 4 mg Oral Tablet Take 4 mg by mouth Three times a day   . vits A,C,E/zinc/copper (VISION-VITE PRESERVE ORAL) Take 1 Tab by mouth Twice daily     Last dept visit:  03/03/2018  Next pending dept visit: 09/08/2018  Patient requested Humalog, Lantus, ozempic, and phentermine [facility] [place]. Routing to provider.  Cortney Dewitt, RN 03/31/2018, 13:09

## 2018-03-31 NOTE — Telephone Encounter (Signed)
Already sent 90 day prescription for phentermine 15 mg daily on 03/03/2018. Next refill not due until around 06/02/2018.

## 2018-03-31 NOTE — Telephone Encounter (Signed)
[facility] [place] [place] on [facility]03/18. Patient is asking [facility] [place], [place]. Routing to provider. Kamin Niblack, RN  03/31/2018, 16:23

## 2018-03-31 NOTE — Telephone Encounter (Signed)
Regarding: Rx refills  ----- Message from [facility] sent at 03/31/2018  1:02 PM EST -----  Doctor Name: Zadie Rhine, MD      Date of last appointment: 03/03/18    Next scheduled visit: 09/08/18    Medication Requested:   insulin lispro (HUMALOG KWIKPEN INSULIN) 100 unit/mL Subcutaneous Insulin Pen  insulin glargine (LANTUS SOLOSTAR U-100 INSULIN) 100 unit/mL Subcutaneous Insulin Pen  semaglutide (OZEMPIC) 1 mg/dose (2 mg/1.5 mL) Subcutaneous Pen Injector  Phentermine 15 mg Oral Capsule      Preferred Pharmacy    [id number] [address]    [address] [place] [address]    Phone: [phone] Fax: [phone]    Not a 24 hour pharmacy; exact hours not known.

## 2018-04-01 MED ORDER — PHENTERMINE 15 MG CAPSULE
15.0000 mg | ORAL_CAPSULE | Freq: Every day | ORAL | 0 refills | Status: DC
Start: 2018-04-01 — End: 2018-09-08

## 2018-04-01 NOTE — Telephone Encounter (Signed)
[facility] [place] [place].    Gordy Levan, MD, MPH 04/01/2018, 07:41

## 2018-07-23 ENCOUNTER — Telehealth (HOSPITAL_BASED_OUTPATIENT_CLINIC_OR_DEPARTMENT_OTHER): Payer: Self-pay | Admitting: "Endocrinology

## 2018-07-23 NOTE — Telephone Encounter (Signed)
Received Standard Written Order from [facility]. Form completed and placed in providers box, along with chart notes, for signature.Isabella Terrell  07/23/2018, 09:30

## 2018-07-28 NOTE — Telephone Encounter (Signed)
Just sent SWO & chart notes signed by Provider back to [facility] at this time. Conformation received, sent to scan to chart.  Neita Goodnight, [place]  07/28/2018, 07:31

## 2018-08-28 ENCOUNTER — Encounter (FREE_STANDING_LABORATORY_FACILITY)
Admit: 2018-08-28 | Discharge: 2018-08-28 | Disposition: A | Discharge status: Home / Self Care | Payer: No Typology Code available for payment source

## 2018-08-28 ENCOUNTER — Other Ambulatory Visit: Payer: Self-pay

## 2018-08-28 ENCOUNTER — Other Ambulatory Visit (INDEPENDENT_AMBULATORY_CARE_PROVIDER_SITE_OTHER): Payer: No Typology Code available for payment source

## 2018-08-28 ENCOUNTER — Encounter (FREE_STANDING_LABORATORY_FACILITY): Payer: No Typology Code available for payment source

## 2018-08-28 DIAGNOSIS — E119 Type 2 diabetes mellitus without complications: Secondary | ICD-10-CM

## 2018-08-28 DIAGNOSIS — R600 Localized edema: Secondary | ICD-10-CM

## 2018-08-28 LAB — CBC/DIFF - CLIENT CONSOLIDATED
BASOPHIL #: <0.1 10*3/uL (ref <=0.20)
BASOPHIL %: 1 %
EOSINOPHIL #: 0.14 10*3/uL (ref <=0.50)
EOSINOPHIL %: 3 %
HCT: 39.3 % (ref 34.8–46.0)
HGB: 13.3 g/dL (ref 11.5–16.0)
IMMATURE GRANULOCYTE #: <0.1 10*3/uL (ref <0.10)
IMMATURE GRANULOCYTE %: 0 % (ref 0–1)
LYMPHOCYTE #: 2.58 10*3/uL (ref 1.00–4.80)
LYMPHOCYTE %: 48 %
MCH: 31.4 pg (ref 26.0–32.0)
MCHC: 33.8 g/dL (ref 31.0–35.5)
MCV: 92.7 fL (ref 78.0–100.0)
MONOCYTE #: 0.44 10*3/uL (ref 0.20–1.10)
MONOCYTE %: 8 %
MPV: 10.1 fL (ref 8.7–12.5)
NEUTROPHIL #: 2.17 10*3/uL (ref 1.50–7.70)
NEUTROPHIL %: 40 %
PLATELETS: 208 10*3/uL (ref 150–400)
RBC: 4.24 10*6/uL (ref 3.85–5.22)
RDW-CV: 12.6 % (ref 11.5–15.5)
WBC: 5.4 10*3/uL (ref 3.7–11.0)

## 2018-08-28 LAB — CBC WITH DIFF
BASOPHIL #: <0.1 10*3/uL (ref <=0.20)
BASOPHIL %: 1 %
EOSINOPHIL #: 0.14 10*3/uL (ref <=0.50)
EOSINOPHIL %: 3 %
HCT: 39.3 % (ref 34.8–46.0)
HGB: 13.3 g/dL (ref 11.5–16.0)
IMMATURE GRANULOCYTE #: <0.1 10*3/uL (ref <0.10)
IMMATURE GRANULOCYTE %: 0 % (ref 0–1)
IMMATURE GRANULOCYTE %: 0 % (ref 0–1)
LYMPHOCYTE #: 2.58 10*3/uL (ref 1.00–4.80)
LYMPHOCYTE %: 48 %
MCH: 31.4 pg (ref 26.0–32.0)
MCHC: 33.8 g/dL (ref 31.0–35.5)
MCV: 92.7 fL (ref 78.0–100.0)
MONOCYTE #: 0.44 10*3/uL (ref 0.20–1.10)
MONOCYTE %: 8 %
MPV: 10.1 fL (ref 8.7–12.5)
NEUTROPHIL #: 2.17 10*3/uL (ref 1.50–7.70)
NEUTROPHIL %: 40 %
PLATELETS: 208 10*3/uL (ref 150–400)
RBC: 4.24 10*6/uL (ref 3.85–5.22)
RDW-CV: 12.6 % (ref 11.5–15.5)
WBC: 5.4 10*3/uL (ref 3.7–11.0)

## 2018-08-28 LAB — COMPREHENSIVE METABOLIC PANEL, NON-FASTING
ALBUMIN: 4.1 g/dL (ref 3.4–4.8)
ALKALINE PHOSPHATASE: 73 U/L (ref 50–130)
ALT (SGPT): 41 U/L (ref <55)
ANION GAP: 11 mmol/L (ref 4–13)
AST (SGOT): 31 U/L (ref 8–41)
BILIRUBIN TOTAL: 0.7 mg/dL (ref 0.3–1.3)
BUN/CREA RATIO: 16 (ref 6–22)
BUN: 11 mg/dL (ref 8–25)
CALCIUM: 9.5 mg/dL (ref 8.5–10.2)
CHLORIDE: 103 mmol/L (ref 96–111)
CO2 TOTAL: 25 mmol/L (ref 22–32)
CREATININE: 0.68 mg/dL (ref 0.49–1.10)
ESTIMATED GFR: >60 mL/min/{1.73_m2} (ref >60)
GLUCOSE: 159 mg/dL — ABNORMAL HIGH (ref 65–139)
POTASSIUM: 4 mmol/L (ref 3.5–5.1)
PROTEIN TOTAL: 7.2 g/dL (ref 6.0–8.0)
SODIUM: 139 mmol/L (ref 136–145)

## 2018-09-01 ENCOUNTER — Telehealth (HOSPITAL_BASED_OUTPATIENT_CLINIC_OR_DEPARTMENT_OTHER): Payer: Self-pay | Admitting: "Endocrinology

## 2018-09-02 ENCOUNTER — Other Ambulatory Visit (HOSPITAL_COMMUNITY): Payer: Self-pay

## 2018-09-02 DIAGNOSIS — Z1239 Encounter for other screening for malignant neoplasm of breast: Secondary | ICD-10-CM

## 2018-09-04 NOTE — Progress Notes (Signed)
[place] [facility]  RETURN VISIT    Chief Complaint:  Type I Diabetes Mellitus   Referring Provider: Marlene Lard, APRN-FNP-BC  PCP: Marlene Lard, APRN-FNP-BC     HPI:   Isabella Terrell is a 63 y.o. female with PMH of HTN, HLD, peripheral neuropathy, and diabetic retinopathy who presents for follow up of type I diabetes mellitus.     Past history:  -Diagnosed with type II diabetes mellitus about 25 years ago after presenting to her PCP with polyuria  -Has never been hospitalized for high or low sugars. Has never been in DKA  -Was having multiple UTI and yeast infections so her gynecologist checked her HgA1C on 11/21/16 which was elevated to 12.6%. No longer has frequent UTIs. Now only has one around every 2 years. She currently has a UTI being treated with a 10 day course of antibiotic.   -When established here, GAD and islet cell antibodies were checked to evaluate for type I DM with GAD antibodies coming back positive likely indicating LADA however patient also has insulin resistance  -Previously on Januvia 100 mg qDaily and metformin 1000 mg BID discontinued after type I diagnosis. States that it took about 5 months to build a tolerance to the GI side effects of metformin.   -Now on Lantus 36 units qPM (was previously up to 72 units nightly) and Novolog around 22-28 units TID-AC in addition to Ozempic 1 mg weekly as patient has some element of insulin resistance. She states she increased her Lantus to 40 units in May 2020 as her sugars were high at that time since she was having a lot of stress and back pain in addition to pain in her feet from development of peripheral neuropathy  -She has a [employer]. She is checking her sugars 4-8 times a day. AM sugars recently are 110-120s, pre-lunch 140-180s, pre-dinner 120-160s with rare 190-240s, and bedtime sugars 140-180's and once had lows in the 60's (was preceded by a high of 248 at dinner). Patient has lows into the 50-60's about once a week  or two usually before dinner or before bedtime if it happens. She treats lows with food or juice. She has a glucagon kit in case of emergencies but has never had to use it.     She is concerned about the peripheral neuropathy developing in her toes mostly on the right side. She states that it is worse at night and that it makes her feel like her legs are heavy at times. She also feels like she has some restless legs with it. Her PCP had previously prescribed Neurontin 100-200 mg nightly but patient did not find that it helped much. She has never been on a higher dose than that. She has never tried Lyrica or Cymbalta. She is concerned about her weight as she gained 20 pounds after starting insulin. Her weight in 10/2017 was 168, weight in 02/2018 was 176, and her weight now is stabilized at 175. She would be willing to try either metformin or Jardiance to help with insulin resistance to be able to decrease her insulin amounts. She is currently doing intermittent fasting and only eats between 10 AM and 6 PM. She states that she watches what she eats and that she is considering trying GOLO diet supplements (contain zinc, chromium, berberine, rhodiola, inositol). She is now properly taking her mealtime insulin 10-15 minutes before she eats which is preventing big meal time spikes in sugars. She did not try phentermine as she  was having a lot of anxiety surrounding COVID and other stressors in her life so she would not like to try that medication. Patient reports a headache today. She states she has been walking about 4-8 miles a day.     DIABETIC COMPLICATIONS:  Microvascular Complications:    Peripheral/Autonomic Neuropathy: +Mild peripheral neuropathy, not currently on medication   Renal: Last GFR >60 in 08/2018, Microalb/crt ratio 22.6 on 12/17/16. Currently on lisinopril 10 mg qDaily   Retinopathy: Last eye exam 02/2018. Has diabetic retinopathy requiring eye injections. Follows with Dr. Lamona Curl in [place],  [place]  Macrovascular Complications:   Hx of CAD: No   Hx of statin/ASA: On Lipitor, no aspirin; SGLT2 or GLP-1 therapy? No SGLT-2; currently on Ozempic   Hx of Stroke: No   Hx of HTN: Yes   Hx of HLD: Yes     CURRENT ANTI-DIABETIC REGIMEN:  Patient is adherent to meds   Lantus 36 units qPM   Humalog 22-28 units TID-AC   Ozempic 1 mg weekly     PREVIOUS ANTI-DIABETIC MEDS:   Januvia- D/Ced to switch to insulin   Metformin- D/Ced to switch to insulin     BLOOD GLUCOSE TRENDS: Testing BG 4-8 times per day   Time Readings   Before Breakfast 110-120s   Before Lunch 140-180s   Before Supper 120-160s with rare 190-240   Bedtime  140-180's and once had lows in the 60's (preceded by high in 240s)      HYPOGLYCEMIA EVALUATION:  Is it occurring? Symptoms? About 1x/week at various times; symptoms include feeling weak   Hypoglycemic Event: (Date/Time) About once a week into 50-60s. Sometimes at bedtime, sometimes before dinner   Treatment: Food, juice   Assisted or Unassisted: Unassisted   Hypoglycemic unawareness? No; feels weird when sugars in 70s        REVIEW OF SYSTEMS:  In addition to HPI...  Constitutional: negative  Eyes: negative  Ears, nose, mouth, throat, and face: negative  Respiratory: negative  Cardiovascular: negative  Gastrointestinal: negative  Genitourinary:negative  Integument/breast: negative  Hematologic/lymphatic: negative  Musculoskeletal:negative  Neurological: Positive for headache, lower extremity numbness/tingling  Behavioral/Psych: negative  Endocrine: negative  Allergic/Immunologic: negative  All other ROS Negative    Problem List:  Type I Diabetes Mellitus  Hypertension  Hyperlipidemia  Difficulty Losing Weight    Past Medical History:   Diagnosis Date   . Diabetic retinopathy (CMS [place])    . HLD (hyperlipidemia)    . HTN (hypertension)    . Type I diabetes mellitus (CMS [facility])      Past Surgical History:   Procedure Laterality Date   . ENDOMETRIAL ABLATION     . HX TONSILLECTOMY     . HX WISDOM  TEETH EXTRACTION     . TRIGGER FINGER RELEASE       Social History     Tobacco Use   . Smoking status: Never Smoker   . Smokeless tobacco: Never Used   Substance Use Topics   . Alcohol use: No   . Drug use: No        Family Medical History:     Problem Relation (Age of Onset)    Coronary Artery Disease Mother    Diabetes Father    Diabetes type II Father, Brother, Paternal Uncle, Sister    Hypothyroidism Mother, Sister    Multiple Sclerosis Brother    Other Mother, Father        Current Outpatient Medications   Medication Sig   .  aspirin (ECOTRIN) 81 mg Oral Tablet, Delayed Release (E.C.) Take 81 mg by mouth Once a day   . atorvastatin (LIPITOR) 20 mg Oral Tablet Take 1 Tab (20 mg total) by mouth Once a day   . Blood Sugar Diagnostic (ONETOUCH VERIO) Strip To check sugars 4 times daily as directed   . cholecalciferol, Vitamin D3, (VITAMIN D-3) 5,000 unit Oral Tablet Take 5,000 Units by mouth Once a day   . empagliflozin (JARDIANCE) 25 mg Oral Tablet Take 1 Tab (25 mg total) by mouth Once a day   . flash glucose scanning reader (FREESTYLE LIBRE 14 DAY READER) Does not apply Misc To test blood sugars E10.9   . flash glucose sensor (FREESTYLE LIBRE 14 DAY SENSOR) Does not apply Kit To test blood sugars E10.9   . gabapentin (NEURONTIN) 300 mg Oral Capsule Take 1 Cap (300 mg total) by mouth Every night   . glucagon (GLUCAGEN) 1 mg/mL Injection Recon Soln 1 mg by Intramuscular route Once, as needed for Other (Blood sugar less than 70 and cannot eat) for up to 1 dose   . insulin glargine (LANTUS SOLOSTAR U-100 INSULIN) 100 unit/mL Subcutaneous Insulin Pen Take up to 50 units nightly   . insulin lispro (HUMALOG KWIKPEN INSULIN) 100 unit/mL Subcutaneous Insulin Pen Take up to 30 units three times a day as instructed   . Insulin Needles, Disposable, (PEN NEEDLE) 32 gauge x 5/32" Needle To inject 4 times per day   . lancets (ONE TOUCH DELICA) 33 gauge Misc To check sugars 4 times daily as directed   . lisinopril (PRINIVIL) 10  mg Oral Tablet Take 10 mg by mouth Once a day   . semaglutide (OZEMPIC) 1 mg/dose (2 mg/1.5 mL) Subcutaneous Pen Injector 1 mg by Subcutaneous route Every 7 days   . topiramate (TOPAMAX) 25 mg Oral Tablet 25 mg nightly for 1 week then increase to 50 mg nightly   . vits A,C,E/zinc/copper (VISION-VITE PRESERVE ORAL) Take 1 Tab by mouth Twice daily     No Known Allergies    PHYSICAL EXAM:   Vitals:    09/08/18 1123   BP: (!) 143/77   Pulse: 96   Temp: 36.6 C (97.8 F)   SpO2: 99%   Weight: 79.7 kg (175 lb 11.3 oz)   Height: 1.549 m (_0 )   BMI: 33.27   General: Well appearing female in no acute distress.  Psychiatric: Normal mood and affect  Neuro: Alert and oriented x 3. Speech fluent. Cranial nerves II-XII intact. No focal deficits noted.   HEENT: Head normocephalic, atraumatic. PERRLA, EOMI, conjunctiva clear. Oral mucosa pink and moist.   Thyroid/Neck: Trachea midline  Lungs: Normal respiratory effort.   Cardiac: Regular rate and rhythm  Abdomen: Non-distended.   Extremities: No edema or erythema.  Skin: No visible rashes.  Diabetic foot exam:  No edema bilaterally. No ulcerations or lesions bilaterally. Sensation normal bilaterally.        LABS:  Lab Results   Component Value Date/Time    HA1C 8.1 (H) 03/03/2018 12:51 PM    HA1C 7.8 (H) 06/03/2017    SODIUM 139 08/28/2018 09:10 AM    SODIUM 138 06/03/2017    POTASSIUM 4.0 08/28/2018 09:10 AM    POTASSIUM 4.2 06/03/2017    CHLORIDE 103 08/28/2018 09:10 AM    CHLORIDE 100 06/03/2017    CO2 25 08/28/2018 09:10 AM    CO2 32 06/03/2017    BUN 11 08/28/2018 09:10 AM    BUN 14  06/03/2017    CREATININE 0.68 08/28/2018 09:10 AM    CREATININE 0.4 06/03/2017    MICALBCRERAT 22.6 12/17/2016 02:28 PM    AST 31 08/28/2018 09:10 AM    AST 18 06/03/2017    ALT 41 08/28/2018 09:10 AM    ALT 23 06/03/2017     12/24/2016 1:28 PM - Edi, Reference Lab Results In      Component Results     Component Value Ref Range & Units Status    GAD65 AB ASSAY 0.12 (H) <=0.02 nmol/L Final          12/24/2016 1:30 PM - Edi, Reference Lab Results In      Component Results     Component Value Ref Range & Units Status    ISLET ANTIGEN 2 (IA-2) ANTIBODY, SERUM 0.00 <=0.02 nmol/L Final      Ref. Range 12/17/2016 14:28 01/16/2017 10:04 03/03/2018 12:51   TSH Latest Ref Range: 0.350 - 5.000 uIU/mL 1.887  1.247   INTACT PTH Latest Ref Range: 8.5 - 75.0 pg/mL  57.2      ASSESSMENT:  MARSHAY SLATES is a 62 y.o. female with PMH of HTN, HLD, peripheral neuropathy, and diabetic retinopathy who presents for follow up of type I diabetes mellitus.      PLAN:  Better-Controlled Type I Diabetes Mellitus (LADA) with Insulin Resistance with Occasional Hyperglycemia and Rare Hypoglycemia  -Complicated by more occasional hyperglycemia (sugars were bad in May 2020, now improved), occasional hypoglycemia, mild peripheral neuropathy, past microalbuminuria (now good), diabetic retinopathy and macrovascular diseases including HTN, HLD  -Anti-GAD and anti-islet cell antibodies to evaluate for type 1 DM checked and positive for GAD antibody however patient also has insulin resistance and requires higher doses of insulin which have contributed to weight gain  -Most recent HgA1C 8.1% in 02/2018, down from 12.6% in 10/2016; will repeat today. Urine microalbumin/creatinine ratio 22.6 in 11/2016, will repeat today; on lisinopril 10 mg qDaily  -Annual diabetes foot exam done today, 09/08/2018  -Most recent diabetic eye exam 02/2018. Has diabetic retinopathy requiring eye injections. Follows with Dr. Lamona Curl in [place], [place] monthly  -Hypoglycemia occurring about once every 1-2 weeks mostly late at night. Has glucagon injection. Treats with food, juice  -Reviewed blood sugars via Libre CGM download. Discussed that the rare bedtime lows are likely due to over bolusing with dinner. Most sugars are better controlled this month  -Continue Lantus 36 units qPM and Humalog 22-28 units TID-AC. Instructed her that if her bedtime sugar is low, to  still take her Lantus but to eat a bedtime snack like peanut butter crackers and/or orange juice and to use 3 glucose tablets if needed then repeat her sugar to make sure it is coming up and that she does not need to eat a higher carb snack  -Patient likely has insulin resistance as she requires higher doses of insulin. Since she never went into DKA, she likely has some pancreatic beta cell function. Continue Ozempic 1 mg weekly. Will start Jardiance 25 mg daily to help decrease the amount of insulin requirements to help with glucose control and weight loss. Discussed mechanism of action of getting rid of sugars through urination. Discussed benefits which include cardioprotection, possible decreased blood pressure, possible weight loss, and possible decrease in lower extremity edema. Discussed potential side effects including UTI/yeast infections and in rare cases Fournier's gangrene. Discussed potential for dehydration so instructed patient to drink a few glasses of water a day. Discussed that sometimes we can see  a slight decrease in kidney function the first few weeks after starting however this usually goes away and studies have shown renal protective effects for this class of medication. Discussed that in some patients, they can develop euglycemic DKA so if she develops nausea, vomiting, or abdominal pain to go to the ED and we will stop the medication  -Instructed patient to buy over the counter Luvena feminine wipes to help keep the genital area clean and moist to help decrease risk of yeast and UTI infections while on Jardiance  -Has glucometer, test strips, pen needles, lancets, Freestyle Libre  -Most recent BMP 08/2018 with creatinine 0.68 and GFR >60  -Advised patient to check sugars 3-4x/day via [place] and to bring sugar logs and glucometer to clinic appointments  -Discussed starting a medication for peripheral neuropathy as it is more bothersome now. Lyrica and Cymbalta have higher risk of  weight gain and anti-cholinergic effects respectively so will restart Neurontin at 300 mg nightly to see if that helps     Hgba1c Goal <7%   Preprandial Glucose Target 80-140 mg/dL   Peak Postprandial Glucose Target <180 mg/dL    BP Goal: <130/80    Labs today: HgA1C, TSH, urine microalbumin/Cr ratio   Vaccines:   Flu vaccine annually- discuss with PCP   Hepatitis B- discuss with PCP    Hepatitis B, 3-dose series to unvaccinated adults with DM>60 y/o    Hepatitis B, 3-dose series to unvaccinated adults with DM 73-59 y/o    Pneumonia- discuss with PCP    All patients with DM 2-64 y/o with pneumococcal polysaccharide vaccine (PPSV23)   At age 39 years, administer the pneumococcal conjugate vaccine (PCV 13) at least 1 year after vaccination with PPSV23, followed by another dose of vaccine PPSV23 at least 1 year after the last dose of PPSV23    Yearly eye and daily foot exams discussed   Symptoms of potential hypoglycemia discussed and treatment of hypoglycemia discussed   Return visit in 3 months or earlier if needed    The patient will be contacted once any lab work is resulted    CC: Notes to referring physician      Palpable Right Thyroid Nodule- Resolved  -TSH WNL. Thyroid ultrasound done 03/06/17 without thyroid nodules with homogenous thyroid texture    Difficulty Losing Weight  -Discussed continuing intermittent fasting (eating from 10 AM to 6 PM) as patient is doing well with it  -Discussed different weight loss medications including GLP-1 agonists (Victoza, Ozempic, or Saxenda), Phentermine, Topamax  -Patient is already on full dose Ozempic without weight loss. Did not want to start phentermine as she was having anxiety and was worried it would make it worse  -Will start Topamax 25 mg nightly x 1 week then increase to 50 mg nightly to help with headaches. Discussed that some side effects include aftertaste and sleepiness so instructed patient to take it in the evening.   -If cannot lose  weight, patient is considering trying GOLO supplements which contain berberine. Could consider just trying berberine supplement daily as some studies have shown that it can decrease insulin resistance and help with glycemic control   -Patient did not find seeing a dietitian helpful in the past so will not refer at this time  -Continue incorporating exercise into daily routine such as walking, fitness classes, or starting with low-weight exercise machines or free weights.  -TSH normal at 1.247 in 02/2018; will order repeat    Orders Placed This Encounter   .  THYROID STIMULATING HORMONE (SENSITIVE TSH)   . Hemoglobin A1C   . Microalbumin/Creatinine Ratio, Random Urine   . topiramate (TOPAMAX) 25 mg Oral Tablet   . gabapentin (NEURONTIN) 300 mg Oral Capsule   . empagliflozin (JARDIANCE) 25 mg Oral Tablet     Keith Rake, MD, MPH 09/08/2018, 12:48            I saw and examined the patient.  I reviewed the fellow's note.  I agree with the findings and plan of care as documented in the fellow's note.  Any exceptions/additions are edited/noted.    Bridgett Larsson, MD

## 2018-09-08 ENCOUNTER — Encounter (HOSPITAL_BASED_OUTPATIENT_CLINIC_OR_DEPARTMENT_OTHER): Payer: Self-pay | Admitting: "Endocrinology

## 2018-09-08 ENCOUNTER — Ambulatory Visit
Payer: No Typology Code available for payment source | Attending: INTERNAL MEDICINE-ENDOCRINOLOGY-DIABETES AND METABOLISM | Admitting: "Endocrinology

## 2018-09-08 ENCOUNTER — Other Ambulatory Visit: Payer: Self-pay

## 2018-09-08 ENCOUNTER — Ambulatory Visit (HOSPITAL_BASED_OUTPATIENT_CLINIC_OR_DEPARTMENT_OTHER): Payer: No Typology Code available for payment source

## 2018-09-08 VITALS — BP 143/77 | HR 96 | Temp 97.8°F | Ht 61.0 in | Wt 175.7 lb

## 2018-09-08 DIAGNOSIS — I10 Essential (primary) hypertension: Secondary | ICD-10-CM | POA: Insufficient documentation

## 2018-09-08 DIAGNOSIS — E785 Hyperlipidemia, unspecified: Secondary | ICD-10-CM | POA: Insufficient documentation

## 2018-09-08 DIAGNOSIS — R51 Headache: Secondary | ICD-10-CM

## 2018-09-08 DIAGNOSIS — E10649 Type 1 diabetes mellitus with hypoglycemia without coma: Secondary | ICD-10-CM | POA: Insufficient documentation

## 2018-09-08 DIAGNOSIS — R519 Headache, unspecified: Secondary | ICD-10-CM

## 2018-09-08 DIAGNOSIS — Z79899 Other long term (current) drug therapy: Secondary | ICD-10-CM | POA: Insufficient documentation

## 2018-09-08 DIAGNOSIS — E1042 Type 1 diabetes mellitus with diabetic polyneuropathy: Secondary | ICD-10-CM | POA: Insufficient documentation

## 2018-09-08 DIAGNOSIS — E109 Type 1 diabetes mellitus without complications: Secondary | ICD-10-CM

## 2018-09-08 DIAGNOSIS — Z87440 Personal history of urinary (tract) infections: Secondary | ICD-10-CM | POA: Insufficient documentation

## 2018-09-08 DIAGNOSIS — E1069 Type 1 diabetes mellitus with other specified complication: Secondary | ICD-10-CM | POA: Insufficient documentation

## 2018-09-08 DIAGNOSIS — E119 Type 2 diabetes mellitus without complications: Secondary | ICD-10-CM

## 2018-09-08 DIAGNOSIS — E1065 Type 1 diabetes mellitus with hyperglycemia: Secondary | ICD-10-CM | POA: Insufficient documentation

## 2018-09-08 DIAGNOSIS — E10319 Type 1 diabetes mellitus with unspecified diabetic retinopathy without macular edema: Secondary | ICD-10-CM | POA: Insufficient documentation

## 2018-09-08 LAB — HGA1C (HEMOGLOBIN A1C WITH EST AVG GLUCOSE)
ESTIMATED AVERAGE GLUCOSE: 177 mg/dL
HEMOGLOBIN A1C: 7.8 % — ABNORMAL HIGH (ref 4.0–5.6)

## 2018-09-08 LAB — THYROID STIMULATING HORMONE (SENSITIVE TSH): TSH: 1.75 u[IU]/mL (ref 0.350–5.000)

## 2018-09-08 MED ORDER — EMPAGLIFLOZIN 25 MG TABLET
25.0000 mg | ORAL_TABLET | Freq: Every day | ORAL | 1 refills | Status: DC
Start: 2018-09-08 — End: 2018-11-02

## 2018-09-08 MED ORDER — TOPIRAMATE 25 MG TABLET
ORAL_TABLET | ORAL | 2 refills | Status: DC
Start: 2018-09-08 — End: 2018-10-02

## 2018-09-08 MED ORDER — GABAPENTIN 300 MG CAPSULE
300.0000 mg | ORAL_CAPSULE | Freq: Every evening | ORAL | 2 refills | Status: DC
Start: 2018-09-08 — End: 2019-03-04

## 2018-09-09 ENCOUNTER — Telehealth (HOSPITAL_BASED_OUTPATIENT_CLINIC_OR_DEPARTMENT_OTHER): Payer: Self-pay | Admitting: "Endocrinology

## 2018-09-09 NOTE — Telephone Encounter (Signed)
Attempted to reach patient. No answer. LM for patient to return call to clinic. Mailed letter to address in chart.Loyce Dys  09/09/2018, 12:12

## 2018-09-09 NOTE — Telephone Encounter (Signed)
-----   Message from Telford Nab, MD sent at 09/09/2018  9:48 AM EDT -----  Nurses, can you call the patient with the following Mychart message sent and the one I sent yesterday? She was telling me in clinic yesterday that she was not sure if she is able to get into her Mychart:    Hi Isabella Terrell,    Your hemoglobin A1C is looking good at 7.8%. Our goal is around 7% so hopefully the addition of the Jardiance will help bring down the sugars some more so we can ease up on the insulin doses. Please let me know over the next few weeks how your sugars are doing and if you are starting to see any low sugars so we can decrease your insulin doses as needed.     Sincerely,    Keith Rake, MD, MPH 09/09/2018, 09:47

## 2018-09-16 ENCOUNTER — Telehealth (HOSPITAL_BASED_OUTPATIENT_CLINIC_OR_DEPARTMENT_OTHER): Payer: Self-pay | Admitting: "Endocrinology

## 2018-09-16 NOTE — Telephone Encounter (Signed)
Patient returned call to clinic. Stated that she started Neurontin and it has been a life saver. She is sleeping 80% better. Patient verbalized understanding regarding all of message from provider and denied any questions.Loyce Dys  09/16/2018, 08:38

## 2018-09-16 NOTE — Telephone Encounter (Signed)
Venancio Poisson, MD  [facility]            Hi,     Can we contact the patient via telephone to inform her of the message below? I received a notification that this message was unread on patient's MyChart. Thank you!     Venancio Poisson, MD 09/15/2018, 17:26    Previous Messages      ----- Message -----   From: Telford Nab, MD   Sent: 09/08/2018   To: Royston Cowper Neeson   Subject: Unread Message Notification              Hi Pamala Hurry,     I looked into the GOLO supplement and it contains a supplement called berberine which some studies have shown can help with sugar control and weight decreasing insulin resistance. You could consider just trying a berberine supplement daily instead of paying more money for the [place] supplement if its something you would want to try.     I looked into the different neuropathy medications and it looks like Lyrica is the most likely to cause weight gain. Cymbalta and Neurontin both noted that some people have seen weight loss or weight gain with them so it really depends on the person. I send you a prescription for Neurontin 300 mg nightly as it can help with neuropathy in addition to restless legs.     I also sent the prescriptions for Topamax and Jardiance. Please let me know if you have any side effects or symptoms with either of the medications so we can stop them if needed.     Please let me know if you have any problems or questions,     Keith Rake, MD, MPH 09/08/2018, 12:54         Attempted patient contact regarding provider's below message, left message to call clinic back. Sent letter to address on file. Saburo Luger, RN  09/16/2018, 07:16

## 2018-10-01 ENCOUNTER — Other Ambulatory Visit (HOSPITAL_BASED_OUTPATIENT_CLINIC_OR_DEPARTMENT_OTHER): Payer: Self-pay | Admitting: "Endocrinology

## 2018-10-01 DIAGNOSIS — R519 Headache, unspecified: Secondary | ICD-10-CM

## 2018-10-06 ENCOUNTER — Telehealth (HOSPITAL_BASED_OUTPATIENT_CLINIC_OR_DEPARTMENT_OTHER): Payer: Self-pay | Admitting: "Endocrinology

## 2018-10-06 NOTE — Telephone Encounter (Signed)
Isabella Terrell is requesting recent chart notes. Just sent then chart notes from 09/08/2018 & meter upload info at this time. Conformation received.  Merleen Milliner, [place]  10/06/2018, 07:13

## 2018-10-29 ENCOUNTER — Ambulatory Visit
Admission: RE | Admit: 2018-10-29 | Discharge: 2018-10-29 | Disposition: A | Discharge status: Home / Self Care | Payer: No Typology Code available for payment source | Source: Ambulatory Visit

## 2018-10-29 ENCOUNTER — Other Ambulatory Visit: Payer: Self-pay

## 2018-10-29 DIAGNOSIS — Z1239 Encounter for other screening for malignant neoplasm of breast: Secondary | ICD-10-CM

## 2018-10-29 DIAGNOSIS — Z1231 Encounter for screening mammogram for malignant neoplasm of breast: Secondary | ICD-10-CM | POA: Insufficient documentation

## 2018-10-31 ENCOUNTER — Other Ambulatory Visit (HOSPITAL_BASED_OUTPATIENT_CLINIC_OR_DEPARTMENT_OTHER): Payer: Self-pay | Admitting: "Endocrinology

## 2018-10-31 DIAGNOSIS — E119 Type 2 diabetes mellitus without complications: Secondary | ICD-10-CM

## 2018-11-02 ENCOUNTER — Ambulatory Visit (HOSPITAL_BASED_OUTPATIENT_CLINIC_OR_DEPARTMENT_OTHER): Payer: Self-pay | Admitting: "Endocrinology

## 2018-11-02 ENCOUNTER — Other Ambulatory Visit (HOSPITAL_BASED_OUTPATIENT_CLINIC_OR_DEPARTMENT_OTHER): Payer: Self-pay | Admitting: "Endocrinology

## 2018-11-02 DIAGNOSIS — R638 Other symptoms and signs concerning food and fluid intake: Secondary | ICD-10-CM

## 2018-11-02 MED ORDER — CONTRAVE 8 MG-90 MG TABLET,EXTENDED RELEASE
ORAL_TABLET | ORAL | 1 refills | Status: DC
Start: 2018-11-02 — End: 2019-02-02

## 2018-11-02 NOTE — Telephone Encounter (Signed)
Jardiance does not cause constipation but Topamax can. I recommend she stop the Topamax and start taking an over the counter stool softener like Senokot or Colace twice a day and Miralax if needed once a day to help her constipation.     Keith Rake, MD, MPH 11/02/2018, 10:04

## 2018-11-02 NOTE — Telephone Encounter (Signed)
Routing to provider to advise. Kesia Dalto, RN  11/02/2018, 09:21

## 2018-11-02 NOTE — Telephone Encounter (Signed)
Spoke with patient regarding provider's below message, patient verbalized understanding. Patient is asking for alternative Topamax? Routing to provider to advise. Penn Grissett, RN  11/02/2018, 10:22

## 2018-11-02 NOTE — Telephone Encounter (Signed)
Current Outpatient Medications   Medication Sig   . aspirin (ECOTRIN) 81 mg Oral Tablet, Delayed Release (E.C.) Take 81 mg by mouth Once a day   . atorvastatin (LIPITOR) 20 mg Oral Tablet Take 1 Tab (20 mg total) by mouth Once a day   . Blood Sugar Diagnostic (ONETOUCH VERIO) Strip To check sugars 4 times daily as directed   . cholecalciferol, Vitamin D3, (VITAMIN D-3) 5,000 unit Oral Tablet Take 5,000 Units by mouth Once a day   . empagliflozin (JARDIANCE) 25 mg Oral Tablet Take 1 Tab (25 mg total) by mouth Once a day   . flash glucose scanning reader (FREESTYLE LIBRE 14 DAY READER) Does not apply Misc To test blood sugars E10.9   . flash glucose sensor (FREESTYLE LIBRE 14 DAY SENSOR) Does not apply Kit To test blood sugars E10.9   . gabapentin (NEURONTIN) 300 mg Oral Capsule Take 1 Cap (300 mg total) by mouth Every night   . glucagon (GLUCAGEN) 1 mg/mL Injection Recon Soln 1 mg by Intramuscular route Once, as needed for Other (Blood sugar less than 70 and cannot eat) for up to 1 dose   . insulin glargine (LANTUS SOLOSTAR U-100 INSULIN) 100 unit/mL Subcutaneous Insulin Pen Take up to 50 units nightly   . insulin lispro (HUMALOG KWIKPEN INSULIN) 100 unit/mL Subcutaneous Insulin Pen Take up to 30 units three times a day as instructed   . Insulin Needles, Disposable, (PEN NEEDLE) 32 gauge x 5/32" Needle To inject 4 times per day   . lancets (ONE TOUCH DELICA) 33 gauge Misc To check sugars 4 times daily as directed   . lisinopril (PRINIVIL) 10 mg Oral Tablet Take 10 mg by mouth Once a day   . semaglutide (OZEMPIC) 1 mg/dose (2 mg/1.5 mL) Subcutaneous Pen Injector 1 mg by Subcutaneous route Every 7 days   . topiramate (TOPAMAX) 25 mg Oral Tablet TAKE 1 TABLET BY MOUTH EVERY NIGHT FOR 1 WEEK THEN INCREASE TO 2 TABLETS NIGHTLY   . vits A,C,E/zinc/copper (VISION-VITE PRESERVE ORAL) Take 1 Tab by mouth Twice daily     Last dept visit:  09/08/2018  Next pending dept visit: 12/03/2018  Refill request for Jardiance 24m, pended  to provider   SLoyce Dys8/12/2018, 09:11

## 2018-11-02 NOTE — Telephone Encounter (Signed)
Regarding: constipation/medicine update  ----- Message from Myrtie Neither sent at 11/02/2018  9:16 AM EDT -----  Telford Nab, MD    The pt states the medicine has caused constipation and nothing is working to relieve this, and its really bothering her, she states she's gained 5 pounds and she's gagging because she feels so full, she's so constipated.. today she had a bowel movement, but when she goes its just a little bit.   She is not sure of the name of the medicine that is causing the constipation but she states she hopes its not the Jardiance because it is working and its bringing her blood sugars under control. She states she's tried to toughen it out for two months, but it's bothering her.   She states she was also give Popiramate 25 mg and its not working, and she wants to know if you want to increase this?      Please call pt to advise.    Preferred Pharmacy     [id number] [place], [place]    [address] [place] [address]    Phone: [phone] Fax: [phone]

## 2018-11-02 NOTE — Telephone Encounter (Signed)
Spoke with patient regarding provider's below message, patient verbalized understanding. Dravin Lance, RN  11/02/2018, 11:22

## 2018-11-02 NOTE — Telephone Encounter (Signed)
Sent prescription. Will take one tablet daily for 1 week then go up to one tablet twice a day (once in AM and once in evening) to stay on that dose until she sees me again for her appointment. If she does well, then we can talk about further increasing the dose.     Keith Rake, MD, MPH 11/02/2018, 11:02

## 2018-11-02 NOTE — Telephone Encounter (Signed)
There really aren't that many weight loss medications out there. She is already on full dose Ozempic and we ordered phentermine in the past but didn't start it because her blood pressure was uncontrolled at the time and it can cause anxiety. Belviq was pulled from the market due to concern for cancer risk. There is a weight loss medicine called Contrave which can help with appetite suppression but it can be expensive and can also cause constipation and headaches. I don't think she has history of seizures but we can't use Contrave in people with seizure history. The only other option is over the counter Alli which can be purchased at [employer]. It works by Museum/gallery curator not absorb the fat you eat so it comes out in your stool. Many people don't like this medication as it can cause diarrhea, oily stools, and fecal incontinence so I try not to recommend it that much. If she is interested in staying on the Topamax, then she can start taking Senokot or Colace regularly to help with the constipation however if the stool softeners or Miralax don't help then she can stop the medication. If she is interested in trying Contrave, I can send her that prescription but I can't guarantee that insurance will cover it as it usually gets denied.     Keith Rake, MD, MPH 11/02/2018, 10:43

## 2018-11-02 NOTE — Telephone Encounter (Signed)
Spoke with patient regarding provider's below message, patient verbalized understanding. Patient stated she tried Alli in the past and did not like that mediation. Patient stated she would like to try Contrave. Patient stated [facility] [place], [place]. Routing to provider. Lamiyah Schlotter, RN  11/02/2018, 10:54

## 2018-12-03 ENCOUNTER — Encounter (HOSPITAL_BASED_OUTPATIENT_CLINIC_OR_DEPARTMENT_OTHER): Payer: Self-pay | Admitting: "Endocrinology

## 2018-12-10 ENCOUNTER — Telehealth (HOSPITAL_BASED_OUTPATIENT_CLINIC_OR_DEPARTMENT_OTHER): Payer: Self-pay | Admitting: "Endocrinology

## 2018-12-10 NOTE — Telephone Encounter (Signed)
Completed prior auth for Contrave via cover my meds. Prior auth approved from 12/10/18 until 04/09/19. Pharmacy notified. Pharmacy to notify patient. Also sent patient MyChart message. Sent approval to be scanned.Loyce Dys  12/10/2018, 14:29

## 2018-12-28 ENCOUNTER — Other Ambulatory Visit (HOSPITAL_BASED_OUTPATIENT_CLINIC_OR_DEPARTMENT_OTHER): Payer: Self-pay | Admitting: "Endocrinology

## 2018-12-28 DIAGNOSIS — R519 Headache, unspecified: Secondary | ICD-10-CM

## 2018-12-29 MED ORDER — TOPIRAMATE 50 MG TABLET
50.00 mg | ORAL_TABLET | Freq: Every evening | ORAL | 2 refills | Status: DC
Start: 2018-12-29 — End: 2019-03-04

## 2018-12-29 NOTE — Telephone Encounter (Signed)
Current Outpatient Medications   Medication Sig   . aspirin (ECOTRIN) 81 mg Oral Tablet, Delayed Release (E.C.) Take 81 mg by mouth Once a day   . atorvastatin (LIPITOR) 20 mg Oral Tablet Take 1 Tab (20 mg total) by mouth Once a day   . Blood Sugar Diagnostic (ONETOUCH VERIO) Strip To check sugars 4 times daily as directed   . cholecalciferol, Vitamin D3, (VITAMIN D-3) 5,000 unit Oral Tablet Take 5,000 Units by mouth Once a day   . empagliflozin (JARDIANCE) 25 mg Oral Tablet Take 1 Tab (25 mg total) by mouth Once a day   . flash glucose scanning reader (FREESTYLE LIBRE 14 DAY READER) Does not apply Misc To test blood sugars E10.9   . flash glucose sensor (FREESTYLE LIBRE 14 DAY SENSOR) Does not apply Kit To test blood sugars E10.9   . gabapentin (NEURONTIN) 300 mg Oral Capsule Take 1 Cap (300 mg total) by mouth Every night   . glucagon (GLUCAGEN) 1 mg/mL Injection Recon Soln 1 mg by Intramuscular route Once, as needed for Other (Blood sugar less than 70 and cannot eat) for up to 1 dose   . insulin glargine (LANTUS SOLOSTAR U-100 INSULIN) 100 unit/mL Subcutaneous Insulin Pen Take up to 50 units nightly   . insulin lispro (HUMALOG KWIKPEN INSULIN) 100 unit/mL Subcutaneous Insulin Pen Take up to 30 units three times a day as instructed   . Insulin Needles, Disposable, (PEN NEEDLE) 32 gauge x 5/32" Needle To inject 4 times per day   . lancets (ONE TOUCH DELICA) 33 gauge Misc To check sugars 4 times daily as directed   . lisinopril (PRINIVIL) 10 mg Oral Tablet Take 10 mg by mouth Once a day   . naltrexone-bupropion (CONTRAVE) 8-90 mg Oral Tablet Sustained Release One tablet (naltrexone 8 mg/bupropion 90 mg) once daily in the morning for 1 week then increase to 1 tablet twice daily   . semaglutide (OZEMPIC) 1 mg/dose (2 mg/1.5 mL) Subcutaneous Pen Injector 1 mg by Subcutaneous route Every 7 days   . topiramate (TOPAMAX) 25 mg Oral Tablet TAKE 1 TABLET BY MOUTH EVERY NIGHT FOR 1 WEEK THEN INCREASE TO 2 TABLETS NIGHTLY   .  vits A,C,E/zinc/copper (VISION-VITE PRESERVE ORAL) Take 1 Tab by mouth Twice daily     Last dept visit:  09/08/2018  Next pending dept visit: 03/04/2019  Refill Topamax - Pending & routing to Provider.  Merleen Milliner, [place] 12/29/2018, 13:27

## 2019-01-18 ENCOUNTER — Other Ambulatory Visit (HOSPITAL_BASED_OUTPATIENT_CLINIC_OR_DEPARTMENT_OTHER): Payer: Self-pay | Admitting: "Endocrinology

## 2019-01-18 DIAGNOSIS — E109 Type 1 diabetes mellitus without complications: Secondary | ICD-10-CM

## 2019-01-18 MED ORDER — FREESTYLE LIBRE 14 DAY SENSOR KIT
PACK | 3 refills | Status: DC
Start: 2019-01-18 — End: 2019-09-09

## 2019-01-18 NOTE — Telephone Encounter (Signed)
Current Outpatient Medications   Medication Sig   . aspirin (ECOTRIN) 81 mg Oral Tablet, Delayed Release (E.C.) Take 81 mg by mouth Once a day   . atorvastatin (LIPITOR) 20 mg Oral Tablet Take 1 Tab (20 mg total) by mouth Once a day   . Blood Sugar Diagnostic (ONETOUCH VERIO) Strip To check sugars 4 times daily as directed   . cholecalciferol, Vitamin D3, (VITAMIN D-3) 5,000 unit Oral Tablet Take 5,000 Units by mouth Once a day   . empagliflozin (JARDIANCE) 25 mg Oral Tablet Take 1 Tab (25 mg total) by mouth Once a day   . flash glucose scanning reader (FREESTYLE LIBRE 14 DAY READER) Does not apply Misc To test blood sugars E10.9   . flash glucose sensor (FREESTYLE LIBRE 14 DAY SENSOR) Does not apply Kit To test blood sugars E10.9   . gabapentin (NEURONTIN) 300 mg Oral Capsule Take 1 Cap (300 mg total) by mouth Every night   . glucagon (GLUCAGEN) 1 mg/mL Injection Recon Soln 1 mg by Intramuscular route Once, as needed for Other (Blood sugar less than 70 and cannot eat) for up to 1 dose   . insulin glargine (LANTUS SOLOSTAR U-100 INSULIN) 100 unit/mL Subcutaneous Insulin Pen Take up to 50 units nightly   . insulin lispro (HUMALOG KWIKPEN INSULIN) 100 unit/mL Subcutaneous Insulin Pen Take up to 30 units three times a day as instructed   . Insulin Needles, Disposable, (PEN NEEDLE) 32 gauge x 5/32" Needle To inject 4 times per day   . lancets (ONE TOUCH DELICA) 33 gauge Misc To check sugars 4 times daily as directed   . lisinopril (PRINIVIL) 10 mg Oral Tablet Take 10 mg by mouth Once a day   . naltrexone-bupropion (CONTRAVE) 8-90 mg Oral Tablet Sustained Release One tablet (naltrexone 8 mg/bupropion 90 mg) once daily in the morning for 1 week then increase to 1 tablet twice daily   . semaglutide (OZEMPIC) 1 mg/dose (2 mg/1.5 mL) Subcutaneous Pen Injector 1 mg by Subcutaneous route Every 7 days   . topiramate (TOPAMAX) 25 mg Oral Tablet TAKE 1 TABLET BY MOUTH EVERY NIGHT FOR 1 WEEK THEN INCREASE TO 2 TABLETS NIGHTLY   .  topiramate (TOPAMAX) 50 mg Oral Tablet Take 1 Tab (50 mg total) by mouth Every evening   . vits A,C,E/zinc/copper (VISION-VITE PRESERVE ORAL) Take 1 Tab by mouth Twice daily     Last dept visit:  09/08/2018  Next pending dept visit: 03/04/2019  Routing libre sensors refill to provider.  Mariene Dickerman, RN 01/18/2019, 11:02

## 2019-01-18 NOTE — Telephone Encounter (Signed)
Regarding: refill and change pharmacy  ----- Message from Joylene Igo sent at 01/18/2019 11:00 AM EDT -----  Telford Nab, MD    Pt called in needing a refill of this Rx.  She wants it sent to [facility].  She states there is an override waiting for your apporval.  She has been without for 7 days.  Please call to advise.  Thank you!    flash glucose sensor (FREESTYLE LIBRE 14 DAY SENSOR) Does not apply Kit  6 Each  3  11/11/2017     Sig: To test blood sugars E10.9    Sent to pharmacy as: flash glucose sensor kit (FREESTYLE LIBRE 14 DAY SENSOR)    Class: E-Rx      Last appointment:   8.6.20  Next appointment:    12.10.20    Preferred Pharmacy     [id number] [address]   [id number]   Phone: [phone]: [phone]   Not a 24 hour pharmacy; exact hours not known.

## 2019-01-19 ENCOUNTER — Other Ambulatory Visit (HOSPITAL_BASED_OUTPATIENT_CLINIC_OR_DEPARTMENT_OTHER): Payer: Self-pay | Admitting: "Endocrinology

## 2019-01-19 DIAGNOSIS — E119 Type 2 diabetes mellitus without complications: Secondary | ICD-10-CM

## 2019-01-19 MED ORDER — OZEMPIC 1 MG/DOSE (2 MG/1.5 ML) SUBCUTANEOUS PEN INJECTOR
1.00 mg | PEN_INJECTOR | SUBCUTANEOUS | 3 refills | Status: AC
Start: 2019-01-19 — End: ?

## 2019-01-19 NOTE — Telephone Encounter (Signed)
Current Outpatient Medications   Medication Sig   . aspirin (ECOTRIN) 81 mg Oral Tablet, Delayed Release (E.C.) Take 81 mg by mouth Once a day   . atorvastatin (LIPITOR) 20 mg Oral Tablet Take 1 Tab (20 mg total) by mouth Once a day   . Blood Sugar Diagnostic (ONETOUCH VERIO) Strip To check sugars 4 times daily as directed   . cholecalciferol, Vitamin D3, (VITAMIN D-3) 5,000 unit Oral Tablet Take 5,000 Units by mouth Once a day   . empagliflozin (JARDIANCE) 25 mg Oral Tablet Take 1 Tab (25 mg total) by mouth Once a day   . flash glucose scanning reader (FREESTYLE LIBRE 14 DAY READER) Does not apply Misc To test blood sugars E10.9   . flash glucose sensor (FREESTYLE LIBRE 14 DAY SENSOR) Does not apply Kit To test blood sugars E10.9   . gabapentin (NEURONTIN) 300 mg Oral Capsule Take 1 Cap (300 mg total) by mouth Every night   . glucagon (GLUCAGEN) 1 mg/mL Injection Recon Soln 1 mg by Intramuscular route Once, as needed for Other (Blood sugar less than 70 and cannot eat) for up to 1 dose   . insulin glargine (LANTUS SOLOSTAR U-100 INSULIN) 100 unit/mL Subcutaneous Insulin Pen Take up to 50 units nightly   . insulin lispro (HUMALOG KWIKPEN INSULIN) 100 unit/mL Subcutaneous Insulin Pen Take up to 30 units three times a day as instructed   . Insulin Needles, Disposable, (PEN NEEDLE) 32 gauge x 5/32" Needle To inject 4 times per day   . lancets (ONE TOUCH DELICA) 33 gauge Misc To check sugars 4 times daily as directed   . lisinopril (PRINIVIL) 10 mg Oral Tablet Take 10 mg by mouth Once a day   . naltrexone-bupropion (CONTRAVE) 8-90 mg Oral Tablet Sustained Release One tablet (naltrexone 8 mg/bupropion 90 mg) once daily in the morning for 1 week then increase to 1 tablet twice daily   . semaglutide (OZEMPIC) 1 mg/dose (2 mg/1.5 mL) Subcutaneous Pen Injector 1 mg by Subcutaneous route Every 7 days   . topiramate (TOPAMAX) 25 mg Oral Tablet TAKE 1 TABLET BY MOUTH EVERY NIGHT FOR 1 WEEK THEN INCREASE TO 2 TABLETS NIGHTLY   .  topiramate (TOPAMAX) 50 mg Oral Tablet Take 1 Tab (50 mg total) by mouth Every evening   . vits A,C,E/zinc/copper (VISION-VITE PRESERVE ORAL) Take 1 Tab by mouth Twice daily     Last dept visit:  09/08/2018  Next pending dept visit: 03/04/2019  Routing [facility] [place] to provider.  Zehra Rucci, RN 01/19/2019, 11:33

## 2019-01-19 NOTE — Telephone Encounter (Signed)
Regarding: Rx refill request  ----- Message from Benjamine Mola sent at 01/19/2019 11:11 AM EDT -----  Doctor Name:  Telford Nab, MD                        Notes for Nurse or Physician: Pt's husband stated that he is out of the following Rx. Please call to advise. Thank you!           Date of last appointment: 09-08-2018    Next scheduled visit: 03-04-2019    Medication Requested:     semaglutide (OZEMPIC) 1 mg/dose (2 mg/1.5 mL) Subcutaneous Pen Injector  6 Syringe  3  03/31/2018     Sig - Route: 1 mg by Subcutaneous route Every 7 days - Subcutaneous    Sent to pharmacy as: semaglutide 1 mg/dose (2 mg/1.5 mL) subcutaneous pen injector (OZEMPIC)    Class: E-Rx      Preferred Pharmacy:    [id number] [place] [place]    [address] [place] [address]    Phone: [phone] Fax: [phone]    Not a 24 hour pharmacy; exact hours not known.

## 2019-02-02 ENCOUNTER — Other Ambulatory Visit (HOSPITAL_BASED_OUTPATIENT_CLINIC_OR_DEPARTMENT_OTHER): Payer: Self-pay | Admitting: "Endocrinology

## 2019-02-02 DIAGNOSIS — R638 Other symptoms and signs concerning food and fluid intake: Secondary | ICD-10-CM

## 2019-02-02 NOTE — Telephone Encounter (Signed)
Current Outpatient Medications   Medication Sig   . aspirin (ECOTRIN) 81 mg Oral Tablet, Delayed Release (E.C.) Take 81 mg by mouth Once a day   . atorvastatin (LIPITOR) 20 mg Oral Tablet Take 1 Tab (20 mg total) by mouth Once a day   . Blood Sugar Diagnostic (ONETOUCH VERIO) Strip To check sugars 4 times daily as directed   . cholecalciferol, Vitamin D3, (VITAMIN D-3) 5,000 unit Oral Tablet Take 5,000 Units by mouth Once a day   . empagliflozin (JARDIANCE) 25 mg Oral Tablet Take 1 Tab (25 mg total) by mouth Once a day   . flash glucose scanning reader (FREESTYLE LIBRE 14 DAY READER) Does not apply Misc To test blood sugars E10.9   . flash glucose sensor (FREESTYLE LIBRE 14 DAY SENSOR) Does not apply Kit To test blood sugars E10.9   . gabapentin (NEURONTIN) 300 mg Oral Capsule Take 1 Cap (300 mg total) by mouth Every night   . glucagon (GLUCAGEN) 1 mg/mL Injection Recon Soln 1 mg by Intramuscular route Once, as needed for Other (Blood sugar less than 70 and cannot eat) for up to 1 dose   . insulin glargine (LANTUS SOLOSTAR U-100 INSULIN) 100 unit/mL Subcutaneous Insulin Pen Take up to 50 units nightly   . insulin lispro (HUMALOG KWIKPEN INSULIN) 100 unit/mL Subcutaneous Insulin Pen Take up to 30 units three times a day as instructed   . Insulin Needles, Disposable, (PEN NEEDLE) 32 gauge x 5/32" Needle To inject 4 times per day   . lancets (ONE TOUCH DELICA) 33 gauge Misc To check sugars 4 times daily as directed   . lisinopril (PRINIVIL) 10 mg Oral Tablet Take 10 mg by mouth Once a day   . naltrexone-bupropion (CONTRAVE) 8-90 mg Oral Tablet Sustained Release One tablet (naltrexone 8 mg/bupropion 90 mg) once daily in the morning for 1 week then increase to 1 tablet twice daily   . semaglutide (OZEMPIC) 1 mg/dose (2 mg/1.5 mL) Subcutaneous Pen Injector 1 mg by Subcutaneous route Every 7 days   . topiramate (TOPAMAX) 25 mg Oral Tablet TAKE 1 TABLET BY MOUTH EVERY NIGHT FOR 1 WEEK THEN INCREASE TO 2 TABLETS NIGHTLY   .  topiramate (TOPAMAX) 50 mg Oral Tablet Take 1 Tab (50 mg total) by mouth Every evening   . vits A,C,E/zinc/copper (VISION-VITE PRESERVE ORAL) Take 1 Tab by mouth Twice daily     Last dept visit:  09/08/2018  Next pending dept visit: 03/04/2019  Refill: Contrave  - Pending [place]  Merleen Milliner, MA 02/02/2019, 09:36

## 2019-02-25 NOTE — Progress Notes (Addendum)
[facility]  RETURN VISIT    Chief Complaint:  Type I Diabetes Mellitus   Referring Provider: Marlene Lard, APRN-FNP-BC (Inactive)  PCP: Marlene Lard, APRN-FNP-BC (Inactive)     HPI:   Isabella Terrell is a 63 y.o. female with PMH of HTN, HLD, peripheral neuropathy, and diabetic retinopathy who presents for follow up of type I diabetes mellitus.     Past history:  -Diagnosed with type II diabetes mellitus about 25 years ago after presenting to her PCP with polyuria  -Has never been hospitalized for high or low sugars. Has never been in DKA  -Was having multiple UTI and yeast infections so her gynecologist checked her HgA1C on 11/21/16 which was elevated to 12.6%. No longer having frequent UTIs  -When established here, GAD and islet cell antibodies were checked to evaluate for type I DM with GAD antibodies coming back positive likely indicating LADA however patient also has insulin resistance  -She has a [employer]    I uploaded, reviewed, interpreted, and made recommendations based on patient's CGM data.  -In target 66%, high 24%, very high 9%, low 1%, very low 0%      -Most recent HgA1C:  Lab Results   Component Value Date    HA1C 7.8 (H) 09/08/2018     CURRENT ANTI-DIABETIC REGIMEN:  Patient is adherent to meds   Lantus 16 units qPM   Humalog about 20-30 units once a day with dinner (not taking if <150); taking about once a day (estimates about 8 times a week)   Ozempic 1 mg weekly   Jardiance 25 mg daily     PREVIOUS ANTI-DIABETIC MEDS:   Januvia- D/Ced to switch to insulin   Metformin- D/Ced to switch to insulin     BLOOD GLUCOSE TRENDS: Testing BG 10-15 times per day via Freestyle Libre  Time Readings   Before Breakfast 80-140s   Before Lunch 130-200s   Before Supper 180-260s   Bedtime  70-160s, some 200s      HYPOGLYCEMIA EVALUATION:  Is it occurring? Symptoms? About once every 2 weeks; symptoms include feeling weak   Hypoglycemic Event: (Date/Time) About once every 2 weeks     Treatment: Food, juice   Assisted or Unassisted: Unassisted   Hypoglycemic unawareness? No; feels weird when sugars in 70s   -Glucagon kit: Yes    DIABETIC COMPLICATIONS:  Microvascular Complications:    Peripheral/Autonomic Neuropathy: +Mild peripheral neuropathy, On Neurontin qPM   Retinopathy: Last eye exam 02/2019. Has diabetic retinopathy requiring eye injections. Follows with Dr. Lamona Curl in [place], [place]   Renal: Last GFR:   Lab Results   Component Value Date    GFR >60 08/28/2018      Microalb/crt ratio:  Urine Microalb/cr ratio   Lab Results   Component Value Date/Time    MICALBCRERAT 22.6 12/17/2016 02:28 PM         On ACEI/ARB: Now off lisinopril    Macrovascular Complications:   Hx of CAD: No   Hx of statin/ASA: On Lipitor, no aspirin; SGLT2 or GLP-1 therapy? No SGLT-2; currently on Ozempic and Jardiance   Hx of Stroke: No   Hx of HTN: Yes   Hx of HLD: Yes    Has lost about 4 lbs since last appointment.  Wt Readings from Last 15 Encounters:   03/04/19 77.6 kg (171 lb 1.2 oz)   09/08/18 79.7 kg (175 lb 11.3 oz)   03/03/18 80.1 kg (176 lb 9.4 oz)   11/11/17 76.6  kg (168 lb 14 oz)   07/25/17 74.6 kg (164 lb 7.4 oz)   07/15/17 74.9 kg (165 lb 2 oz)   03/11/17 74.7 kg (164 lb 10.9 oz)   01/16/17 69.9 kg (154 lb)   01/16/17 70.3 kg (154 lb 15.7 oz)   12/17/16 71.1 kg (156 lb 12 oz)   10/12/12 67.5 kg (148 lb 13 oz)   09/23/12 68.9 kg (152 lb)     REVIEW OF SYSTEMS:  Positive for some numbness/tingling in legs which is improved with Neurontin.  In addition to HPI...  Constitutional: negative  Eyes: negative  Ears, nose, mouth, throat, and face: negative  Respiratory: negative  Cardiovascular: negative  Gastrointestinal: negative  Genitourinary:negative  Integument/breast: negative  Hematologic/lymphatic: negative  Musculoskeletal:negative  Neurological: Positive for headache, lower extremity numbness/tingling  Behavioral/Psych: negative  Endocrine: negative  Allergic/Immunologic: negative  All other  ROS Negative    Problem List:  Type I Diabetes Mellitus  Hypertension  Hyperlipidemia  Difficulty Losing Weight    Past Medical History:   Diagnosis Date   . Diabetic retinopathy (CMS [place])    . HLD (hyperlipidemia)    . HTN (hypertension)    . Type I diabetes mellitus (CMS [facility])      Past Surgical History:   Procedure Laterality Date   . ENDOMETRIAL ABLATION     . HX TONSILLECTOMY     . HX WISDOM TEETH EXTRACTION     . TRIGGER FINGER RELEASE       Social History     Tobacco Use   . Smoking status: Never Smoker   . Smokeless tobacco: Never Used   Substance Use Topics   . Alcohol use: No   . Drug use: No        Family Medical History:     Problem Relation (Age of Onset)    Coronary Artery Disease Mother    Diabetes Father    Diabetes type II Father, Brother, Paternal Uncle, Sister    Hypothyroidism Mother, Sister    Multiple Sclerosis Brother    Other Mother, Father          Current Outpatient Medications   Medication Sig   . aspirin (ECOTRIN) 81 mg Oral Tablet, Delayed Release (E.C.) Take 81 mg by mouth Once a day   . atorvastatin (LIPITOR) 20 mg Oral Tablet Take 1 Tab (20 mg total) by mouth Once a day   . Blood Sugar Diagnostic (ONETOUCH VERIO) Strip To check sugars 4 times daily as directed   . cholecalciferol, Vitamin D3, (VITAMIN D-3) 5,000 unit Oral Tablet Take 5,000 Units by mouth Once a day   . empagliflozin (JARDIANCE) 25 mg Oral Tablet Take 1 Tab (25 mg total) by mouth Once a day   . flash glucose scanning reader (FREESTYLE LIBRE 14 DAY READER) Does not apply Misc To test blood sugars E10.9   . flash glucose sensor (FREESTYLE LIBRE 14 DAY SENSOR) Does not apply Kit To test blood sugars E10.9   . gabapentin (NEURONTIN) 300 mg Oral Capsule Take 1 Cap (300 mg total) by mouth Every night   . glucagon (GLUCAGEN) 1 mg/mL Injection Recon Soln 1 mg by Intramuscular route Once, as needed for Other (Blood sugar less than 70 and cannot eat) for up to 1 dose   . insulin glargine (LANTUS SOLOSTAR U-100 INSULIN) 100  unit/mL Subcutaneous Insulin Pen Take up to 50 units nightly   . insulin lispro (HUMALOG KWIKPEN INSULIN) 100 unit/mL Subcutaneous Insulin Pen Take up to  30 units three times a day as instructed   . Insulin Needles, Disposable, (PEN NEEDLE) 32 gauge x 5/32" Needle To inject 4 times per day   . lancets (ONE TOUCH DELICA) 33 gauge Misc To check sugars 4 times daily as directed   . lisinopriL (PRINIVIL) 2.5 mg Oral Tablet Take 1 Tab (2.5 mg total) by mouth Once a day   . metFORMIN (GLUCOPHAGE XR) 500 mg Oral Tablet Sustained Release 24 hr Take 1 Tab (500 mg total) by mouth Every night   . naltrexone-bupropion (CONTRAVE) 8-90 mg Oral Tablet Sustained Release Take 1 Tab by mouth Twice daily   . semaglutide (OZEMPIC) 1 mg/dose (2 mg/1.5 mL) Subcutaneous Pen Injector 1 mg by Subcutaneous route Every 7 days   . vits A,C,E/zinc/copper (VISION-VITE PRESERVE ORAL) Take 1 Tab by mouth Twice daily     No Known Allergies    PHYSICAL EXAM:   Vitals:    03/04/19 1235   BP: 110/70   Pulse: 87   Temp: 36.6 C (97.9 F)   SpO2: 96%   Weight: 77.6 kg (171 lb 1.2 oz)   Height: 1.59 m (5' 2.6")   BMI: 30.76      General: Well appearing female in no acute distress.  Psychiatric: Normal mood and affect  Neuro: Alert and oriented x 3. Speech fluent. Cranial nerves II-XII intact. No focal deficits noted.   HEENT: Head normocephalic, atraumatic. PERRLA, EOMI, conjunctiva clear. Wearing a face mask.   Thyroid/Neck: Trachea midline  Lungs: Normal respiratory effort.   Cardiac: Regular rate and rhythm  Abdomen: Non-distended.   Extremities: No edema or erythema.  Skin: No visible rashes.      LABS:  Lab Results   Component Value Date/Time    HA1C 7.8 (H) 09/08/2018 12:13 PM    SODIUM 139 08/28/2018 09:10 AM    POTASSIUM 4.0 08/28/2018 09:10 AM    CHLORIDE 103 08/28/2018 09:10 AM    CO2 25 08/28/2018 09:10 AM    BUN 11 08/28/2018 09:10 AM    CREATININE 0.68 08/28/2018 09:10 AM    MICALBCRERAT 22.6 12/17/2016 02:28 PM    AST 31 08/28/2018 09:10 AM     ALT 41 08/28/2018 09:10 AM     12/24/2016 1:28 PM - Edi, Reference Lab Results In      Component Results     Component Value Ref Range & Units Status    GAD65 AB ASSAY 0.12 (H) <=0.02 nmol/L Final     12/24/2016 1:30 PM - Edi, Reference Lab Results In      Component Results     Component Value Ref Range & Units Status    ISLET ANTIGEN 2 (IA-2) ANTIBODY, SERUM 0.00 <=0.02 nmol/L Final      Ref. Range 12/17/2016 14:28 01/16/2017 10:04 03/03/2018 12:51   TSH Latest Ref Range: 0.350 - 5.000 uIU/mL 1.887  1.247   INTACT PTH Latest Ref Range: 8.5 - 75.0 pg/mL  57.2      ASSESSMENT:  Isabella Terrell is a 63 y.o.Mar[place] Kitchen female with PMH of HTN, HLD, peripheral neuropathy, and diabetic retinopathy who presents for follow up of type I diabetes mellitus.      PLAN:  Type I Diabetes Mellitus (LADA) with Insulin Resistance complicated by Frequent Evening Hyperglycemia  -Complicated by frequent evening hyperglycemia, rare hypoglycemia, mild peripheral neuropathy, past microalbuminuria (now normal as of 2018), diabetic retinopathy and macrovascular diseases including HTN, HLD  -Anti-GAD and anti-islet cell antibodies to evaluate for type 1 DM checked and  positive for GAD antibody however patient also has insulin resistance and requires higher doses of insulin which has contributed to weight gain  -Most recent HgA1C 7.8 in 08/2018; will repeat today. Urine microalbumin/creatinine ratio 22.6 in 11/2016, will repeat today; will restart lisinopril 2.5 mg daily given past microalbuminuria  -Annual diabetes foot exam done 09/08/2018  -Most recent diabetic eye exam 02/2019. Has diabetic retinopathy requiring eye injections. Follows with Dr. Lamona Curl in [place], [place] monthly  -Hypoglycemia rarely occuring. Has glucagon injection. Treats with food, juice  -Reviewed blood sugars via Libre CGM download. AM and before bedtime sugars well-controlled but high before lunch and dinner  -Continue Lantus 16 units qPM. Will restart Humalog at 8 units  with breakfast, 8 units with lunch, and decrease to 20 units with dinner. Instructed patient to let me know if starts having lows after starting metformin and will decrease Humalog amounts if needed  -Patient likely has insulin resistance as she requires higher doses of insulin. Since she never went into DKA, she likely has some pancreatic beta cell function. Continue Ozempic 1 mg weekly and Jardiance 25 mg daily to help decrease the amount of insulin requirements to help with glucose control and weight loss. Will also restart metformin-XR 500 mg nightly to help with insulin resistance and try to decrease the amount of insulin requirements  -Has glucometer, test strips, pen needles, lancets, Freestyle Libre  -Most recent BMP 08/2018 with creatinine 0.68 and GFR >60, will repeat today  -Advised patient to check sugars 4x/day via [place] and to bring sugar logs and glucometer to clinic appointments  -Continue Neurontin 300 mg qPM for peripheral neuropathy; refilled     Hgba1c Goal <7%   Preprandial Glucose Target 80-140 mg/dL   Peak Postprandial Glucose Target <180 mg/dL    BP Goal: <130/80    Labs today: HgA1C, BMP, urine microalbumin/Cr ratio   Vaccines:   Flu vaccine annually- discuss with PCP   Hepatitis B- discuss with PCP    Hepatitis B, 3-dose series to unvaccinated adults with DM>60 y/o    Hepatitis B, 3-dose series to unvaccinated adults with DM 51-59 y/o    Pneumonia- discuss with PCP    All patients with DM 2-64 y/o with pneumococcal polysaccharide vaccine (PPSV23)   At age 22 years, administer the pneumococcal conjugate vaccine (PCV 13) at least 1 year after vaccination with PPSV23, followed by another dose of vaccine PPSV23 at least 1 year after the last dose of PPSV23    Yearly eye and daily foot exams discussed   Symptoms of potential hypoglycemia discussed and treatment of hypoglycemia discussed   Return visit in 6 months or earlier if needed    The patient will be contacted  once any lab work is resulted    CC: Notes to referring physician      Palpable Right Thyroid Nodule- Resolved  -TSH WNL. Thyroid ultrasound done 03/06/17 without thyroid nodules with homogenous thyroid texture    Difficulty Losing Weight  -Discussed continuing intermittent fasting (eating from 10 AM to 6 PM) as patient is doing well with it  -Have previously discussed weight loss medications including GLP-1 agonists (Victoza, Ozempic, or Saxenda), Phentermine, Topamax  -Patient is already on full dose Ozempic without weight loss. Did not want to start phentermine as she was having anxiety and was worried it would make it worse  -D/Ced Topamax as did not have effect. Continue Contrave 8-90 mg to 2 tabs BID; has lost 4 lbs on this   -  Patient did not find seeing a dietitian helpful in the past so will not refer at this time  -Continue incorporating exercise into daily routine such as walking, fitness classes, low-weight exercise machines or free weights  -TSH normal at 1.750 in 08/2018    Orders Placed This Encounter   . Hemoglobin A1C   . Basic Metabolic Panel   . Microalbumin/Creatinine Ratio, Random Urine   . lisinopriL (PRINIVIL) 2.5 mg Oral Tablet   . metFORMIN (GLUCOPHAGE XR) 500 mg Oral Tablet Sustained Release 24 hr   . gabapentin (NEURONTIN) 300 mg Oral Capsule     Keith Rake, MD, MPH 03/04/2019, 13:35

## 2019-03-04 ENCOUNTER — Ambulatory Visit: Payer: No Typology Code available for payment source | Attending: "Endocrinology | Admitting: "Endocrinology

## 2019-03-04 ENCOUNTER — Encounter (HOSPITAL_BASED_OUTPATIENT_CLINIC_OR_DEPARTMENT_OTHER): Payer: Self-pay | Admitting: "Endocrinology

## 2019-03-04 ENCOUNTER — Ambulatory Visit (HOSPITAL_BASED_OUTPATIENT_CLINIC_OR_DEPARTMENT_OTHER): Payer: No Typology Code available for payment source

## 2019-03-04 VITALS — BP 110/70 | HR 87 | Temp 97.9°F | Ht 62.6 in | Wt 171.1 lb

## 2019-03-04 DIAGNOSIS — E8881 Metabolic syndrome: Secondary | ICD-10-CM

## 2019-03-04 DIAGNOSIS — Z79899 Other long term (current) drug therapy: Secondary | ICD-10-CM | POA: Insufficient documentation

## 2019-03-04 DIAGNOSIS — R638 Other symptoms and signs concerning food and fluid intake: Secondary | ICD-10-CM

## 2019-03-04 DIAGNOSIS — Z7984 Long term (current) use of oral hypoglycemic drugs: Secondary | ICD-10-CM | POA: Insufficient documentation

## 2019-03-04 DIAGNOSIS — E10649 Type 1 diabetes mellitus with hypoglycemia without coma: Secondary | ICD-10-CM | POA: Insufficient documentation

## 2019-03-04 DIAGNOSIS — E1042 Type 1 diabetes mellitus with diabetic polyneuropathy: Secondary | ICD-10-CM | POA: Insufficient documentation

## 2019-03-04 DIAGNOSIS — I10 Essential (primary) hypertension: Secondary | ICD-10-CM | POA: Insufficient documentation

## 2019-03-04 DIAGNOSIS — E1065 Type 1 diabetes mellitus with hyperglycemia: Secondary | ICD-10-CM | POA: Insufficient documentation

## 2019-03-04 DIAGNOSIS — F419 Anxiety disorder, unspecified: Secondary | ICD-10-CM | POA: Insufficient documentation

## 2019-03-04 DIAGNOSIS — E108 Type 1 diabetes mellitus with unspecified complications: Secondary | ICD-10-CM

## 2019-03-04 DIAGNOSIS — E10319 Type 1 diabetes mellitus with unspecified diabetic retinopathy without macular edema: Secondary | ICD-10-CM | POA: Insufficient documentation

## 2019-03-04 DIAGNOSIS — R635 Abnormal weight gain: Secondary | ICD-10-CM | POA: Insufficient documentation

## 2019-03-04 DIAGNOSIS — Z794 Long term (current) use of insulin: Secondary | ICD-10-CM

## 2019-03-04 DIAGNOSIS — E119 Type 2 diabetes mellitus without complications: Secondary | ICD-10-CM

## 2019-03-04 DIAGNOSIS — Z9641 Presence of insulin pump (external) (internal): Secondary | ICD-10-CM

## 2019-03-04 DIAGNOSIS — E785 Hyperlipidemia, unspecified: Secondary | ICD-10-CM | POA: Insufficient documentation

## 2019-03-04 LAB — BASIC METABOLIC PANEL
ANION GAP: 9 mmol/L (ref 4–13)
BUN/CREA RATIO: 15 (ref 6–22)
BUN: 12 mg/dL (ref 8–25)
CALCIUM: 9.8 mg/dL (ref 8.5–10.2)
CHLORIDE: 102 mmol/L (ref 96–111)
CO2 TOTAL: 30 mmol/L (ref 22–32)
CREATININE: 0.78 mg/dL (ref 0.49–1.10)
ESTIMATED GFR: >60 mL/min/{1.73_m2} (ref >60)
GLUCOSE: 194 mg/dL — ABNORMAL HIGH (ref 65–139)
POTASSIUM: 3.9 mmol/L (ref 3.5–5.1)
SODIUM: 141 mmol/L (ref 136–145)

## 2019-03-04 LAB — MICROALBUMIN/CREATININE RATIO, URINE, RANDOM
CREATININE RANDOM URINE: 76 mg/dL
MICROALBUMIN RANDOM URINE: 0.6 mg/dL (ref 0.0–2.0)
MICROALBUMIN/CREATININE RATIO RANDOM URINE: 7.9 mg/g (ref <=30.0)

## 2019-03-04 MED ORDER — METFORMIN ER 500 MG TABLET,EXTENDED RELEASE 24 HR
500.00 mg | ORAL_TABLET | Freq: Every evening | ORAL | 1 refills | Status: DC
Start: 2019-03-04 — End: 2019-07-07

## 2019-03-04 MED ORDER — GABAPENTIN 300 MG CAPSULE
300.00 mg | ORAL_CAPSULE | Freq: Every evening | ORAL | 2 refills | Status: DC
Start: 2019-03-04 — End: 2019-09-09

## 2019-03-04 MED ORDER — LISINOPRIL 2.5 MG TABLET
2.5000 mg | ORAL_TABLET | Freq: Every day | ORAL | 4 refills | Status: DC
Start: 2019-03-04 — End: 2019-09-09

## 2019-03-04 NOTE — Patient Instructions (Signed)
Continue Jardiance 25 mg daily and Ozempic 1 mg weekly.  Continue Lantus 16 units daily.   For your mealtime, short-acting insulin (Humalog), take 8 units with breakfast and lunch and 20 units with dinner

## 2019-03-05 LAB — HGA1C (HEMOGLOBIN A1C WITH EST AVG GLUCOSE)
ESTIMATED AVERAGE GLUCOSE: 171 mg/dL
HEMOGLOBIN A1C: 7.6 % — ABNORMAL HIGH (ref 4.0–5.6)

## 2019-04-26 ENCOUNTER — Other Ambulatory Visit (HOSPITAL_BASED_OUTPATIENT_CLINIC_OR_DEPARTMENT_OTHER): Payer: Self-pay | Admitting: "Endocrinology

## 2019-04-26 DIAGNOSIS — R638 Other symptoms and signs concerning food and fluid intake: Secondary | ICD-10-CM

## 2019-04-26 DIAGNOSIS — E785 Hyperlipidemia, unspecified: Secondary | ICD-10-CM

## 2019-04-26 MED ORDER — ATORVASTATIN 20 MG TABLET
20.0000 mg | ORAL_TABLET | Freq: Every day | ORAL | 2 refills | Status: DC
Start: 2019-04-26 — End: 2020-02-02

## 2019-04-26 MED ORDER — CONTRAVE 8 MG-90 MG TABLET,EXTENDED RELEASE
ORAL_TABLET | ORAL | 2 refills | Status: DC
Start: 2019-04-26 — End: 2019-09-09

## 2019-04-26 NOTE — Telephone Encounter (Signed)
Current Outpatient Medications   Medication Sig   . aspirin (ECOTRIN) 81 mg Oral Tablet, Delayed Release (E.C.) Take 81 mg by mouth Once a day   . atorvastatin (LIPITOR) 20 mg Oral Tablet Take 1 Tab (20 mg total) by mouth Once a day   . Blood Sugar Diagnostic (ONETOUCH VERIO) Strip To check sugars 4 times daily as directed   . cholecalciferol, Vitamin D3, (VITAMIN D-3) 5,000 unit Oral Tablet Take 5,000 Units by mouth Once a day   . empagliflozin (JARDIANCE) 25 mg Oral Tablet Take 1 Tab (25 mg total) by mouth Once a day   . flash glucose scanning reader (FREESTYLE LIBRE 14 DAY READER) Does not apply Misc To test blood sugars E10.9   . flash glucose sensor (FREESTYLE LIBRE 14 DAY SENSOR) Does not apply Kit To test blood sugars E10.9   . gabapentin (NEURONTIN) 300 mg Oral Capsule Take 1 Cap (300 mg total) by mouth Every night   . glucagon (GLUCAGEN) 1 mg/mL Injection Recon Soln 1 mg by Intramuscular route Once, as needed for Other (Blood sugar less than 70 and cannot eat) for up to 1 dose   . insulin glargine (LANTUS SOLOSTAR U-100 INSULIN) 100 unit/mL Subcutaneous Insulin Pen Take up to 50 units nightly   . insulin lispro (HUMALOG KWIKPEN INSULIN) 100 unit/mL Subcutaneous Insulin Pen Take up to 30 units three times a day as instructed   . Insulin Needles, Disposable, (PEN NEEDLE) 32 gauge x 5/32" Needle To inject 4 times per day   . lancets (ONE TOUCH DELICA) 33 gauge Misc To check sugars 4 times daily as directed   . lisinopriL (PRINIVIL) 2.5 mg Oral Tablet Take 1 Tab (2.5 mg total) by mouth Once a day   . metFORMIN (GLUCOPHAGE XR) 500 mg Oral Tablet Sustained Release 24 hr Take 1 Tab (500 mg total) by mouth Every night   . naltrexone-bupropion (CONTRAVE) 8-90 mg Oral Tablet Sustained Release Take 1 Tab by mouth Twice daily   . semaglutide (OZEMPIC) 1 mg/dose (2 mg/1.5 mL) Subcutaneous Pen Injector 1 mg by Subcutaneous route Every 7 days   . vits A,C,E/zinc/copper (VISION-VITE PRESERVE ORAL) Take 1 Tab by mouth Twice  daily     Last dept visit:  03/04/2019  Next pending dept visit: 09/09/2019  Unable to find documentation regarding continuing statin. Pending blank to provider. Per office note: Continue Contrave 8-90 mg to 2 tabs BID - pending corrected rx to provider. Patient requesting 30 day supply of both.   Caroline Sauger, RN 04/26/2019, 14:35

## 2019-04-26 NOTE — Telephone Encounter (Signed)
Regarding: refill request/ pharmacy needs contacted  ----- Message from Myra Gianotti Brand sent at 04/26/2019  2:25 PM EST -----  Doctor Name:  Gordy Levan       Date of last appointment: 12.10.2020    Next scheduled visit: 6.17.2021  Medication Requested:   atorvastatin (LIPITOR) 20 mg Oral Tablet  naltrexone-bupropion (CONTRAVE) 8-90 mg Oral Tablet Sustained Release     Pharmacy is needing a call before they will fill the Contrave.     Pt needing a 3 month supply oif both Rx's          Preferred Pharmacy        [id number] [place] [address]    [address] [place] [address]    Phone: [phone] Fax: [phone]    Not a 24 hour pharmacy; exact hours not known.

## 2019-05-04 ENCOUNTER — Other Ambulatory Visit (HOSPITAL_BASED_OUTPATIENT_CLINIC_OR_DEPARTMENT_OTHER): Payer: Self-pay | Admitting: "Endocrinology

## 2019-05-04 DIAGNOSIS — E119 Type 2 diabetes mellitus without complications: Secondary | ICD-10-CM

## 2019-05-06 NOTE — Telephone Encounter (Signed)
Current Outpatient Medications   Medication Sig   . aspirin (ECOTRIN) 81 mg Oral Tablet, Delayed Release (E.C.) Take 81 mg by mouth Once a day   . atorvastatin (LIPITOR) 20 mg Oral Tablet Take 1 Tab (20 mg total) by mouth Once a day   . Blood Sugar Diagnostic (ONETOUCH VERIO) Strip To check sugars 4 times daily as directed   . cholecalciferol, Vitamin D3, (VITAMIN D-3) 5,000 unit Oral Tablet Take 5,000 Units by mouth Once a day   . empagliflozin (JARDIANCE) 25 mg Oral Tablet Take 1 Tab (25 mg total) by mouth Once a day   . flash glucose scanning reader (FREESTYLE LIBRE 14 DAY READER) Does not apply Misc To test blood sugars E10.9   . flash glucose sensor (FREESTYLE LIBRE 14 DAY SENSOR) Does not apply Kit To test blood sugars E10.9   . gabapentin (NEURONTIN) 300 mg Oral Capsule Take 1 Cap (300 mg total) by mouth Every night   . glucagon (GLUCAGEN) 1 mg/mL Injection Recon Soln 1 mg by Intramuscular route Once, as needed for Other (Blood sugar less than 70 and cannot eat) for up to 1 dose   . insulin glargine (LANTUS SOLOSTAR U-100 INSULIN) 100 unit/mL Subcutaneous Insulin Pen Take up to 50 units nightly   . insulin lispro (HUMALOG KWIKPEN INSULIN) 100 unit/mL Subcutaneous Insulin Pen Take up to 30 units three times a day as instructed   . Insulin Needles, Disposable, (PEN NEEDLE) 32 gauge x 5/32" Needle To inject 4 times per day   . lancets (ONE TOUCH DELICA) 33 gauge Misc To check sugars 4 times daily as directed   . lisinopriL (PRINIVIL) 2.5 mg Oral Tablet Take 1 Tab (2.5 mg total) by mouth Once a day   . metFORMIN (GLUCOPHAGE XR) 500 mg Oral Tablet Sustained Release 24 hr Take 1 Tab (500 mg total) by mouth Every night   . naltrexone-bupropion (CONTRAVE) 8-90 mg Oral Tablet Sustained Release Take 1 tablets twice daily.   . semaglutide (OZEMPIC) 1 mg/dose (2 mg/1.5 mL) Subcutaneous Pen Injector 1 mg by Subcutaneous route Every 7 days   . vits A,C,E/zinc/copper (VISION-VITE PRESERVE ORAL) Take 1 Tab by mouth Twice  daily     Last dept visit:  03/04/2019  Next pending dept visit: 09/09/2019  Pending jardiance refill  Caroline Sauger, RN 05/06/2019, 10:48

## 2019-06-10 ENCOUNTER — Ambulatory Visit (HOSPITAL_BASED_OUTPATIENT_CLINIC_OR_DEPARTMENT_OTHER): Payer: Self-pay | Admitting: "Endocrinology

## 2019-06-10 NOTE — Telephone Encounter (Signed)
-----   Message from Jonelle Sidle, RN sent at 06/10/2019  2:08 PM EDT -----  Regarding: FW: pt out of rx  ----- Message from Micah Noel Juhl sent at 06/10/2019  1:47 PM EDT -----  Zadie Rhine, MD        Pt calling about the following rx. States she has been out for three months. States pharmacy keeps telling her they are waiting on authorization from you. Please call pt to advise.     naltrexone-bupropion (CONTRAVE) 8-90 mg Oral Tablet Sustained Release    Preferred Pharmacy     [id number] - [place] [address]    [address] [place] [address]    Phone: [phone] Fax: [phone]    Hours: Not open 24 hours

## 2019-06-10 NOTE — Telephone Encounter (Signed)
Completed PA for Contrave via cover my meds. We were not previously notified that Contrave auth had expired. Berkley Harvey is pending, 1-3 days for insurance to reach determination. Will send MyChart message to update patient. Isabella Terrell  06/10/2019, 14:19

## 2019-06-11 NOTE — Telephone Encounter (Signed)
Message from Hezzie Bump sent at 06/10/2019 4:19 PM EDT    Summary: pt returning call    [name], [name], MD     Patient called in stating that she is unable to get into her MyChart for the message. Please call to advise. Thank you              Attempted pt contact regarding Shannon's message,unbale to leave message, mailbox full. Imanie Darrow, RN  06/11/2019, 07:00

## 2019-06-17 ENCOUNTER — Ambulatory Visit (HOSPITAL_BASED_OUTPATIENT_CLINIC_OR_DEPARTMENT_OTHER): Payer: Self-pay | Admitting: "Endocrinology

## 2019-06-17 NOTE — Telephone Encounter (Signed)
-----   Message from Jonelle Sidle, RN sent at 06/17/2019  9:22 AM EDT -----  Regarding: FW: PA update  ----- Message from Hoy Morn sent at 06/17/2019  8:58 AM EDT -----  Zadie Rhine, MD,    Patient would like an update on the Prior auth for her Contrave. She stated that she is gaining a lot of weight while not being on this medication. Please call pt to advise.

## 2019-06-17 NOTE — Telephone Encounter (Signed)
Attempted to call patient several times went straight to voicemail and mailbox is full. Sending patient a MyChart at this time to notify her that we are still waiting on determination for auth. Fidela Salisbury, MA  06/17/2019, 11:36

## 2019-06-24 NOTE — Telephone Encounter (Signed)
Patient never read MyChart about auth. Called and got no answer, left her a message letting her know we started the auth on her contrave and as soon as we hear something she will be notified. Fidela Salisbury, MA  06/24/2019, 13:43

## 2019-07-07 ENCOUNTER — Other Ambulatory Visit (HOSPITAL_BASED_OUTPATIENT_CLINIC_OR_DEPARTMENT_OTHER): Payer: Self-pay | Admitting: "Endocrinology

## 2019-07-07 NOTE — Telephone Encounter (Signed)
Current Outpatient Medications   Medication Sig    aspirin (ECOTRIN) 81 mg Oral Tablet, Delayed Release (E.C.) Take 81 mg by mouth Once a day    atorvastatin (LIPITOR) 20 mg Oral Tablet Take 1 Tab (20 mg total) by mouth Once a day    Blood Sugar Diagnostic (ONETOUCH VERIO) Strip To check sugars 4 times daily as directed    cholecalciferol, Vitamin D3, (VITAMIN D-3) 5,000 unit Oral Tablet Take 5,000 Units by mouth Once a day    empagliflozin (JARDIANCE) 25 mg Oral Tablet Take 1 tablet daily by mouth.    flash glucose scanning reader (FREESTYLE LIBRE 14 DAY READER) Does not apply Misc To test blood sugars E10.9    flash glucose sensor (FREESTYLE LIBRE 14 DAY SENSOR) Does not apply Kit To test blood sugars E10.9    gabapentin (NEURONTIN) 300 mg Oral Capsule Take 1 Cap (300 mg total) by mouth Every night    glucagon (GLUCAGEN) 1 mg/mL Injection Recon Soln 1 mg by Intramuscular route Once, as needed for Other (Blood sugar less than 70 and cannot eat) for up to 1 dose    insulin glargine (LANTUS SOLOSTAR U-100 INSULIN) 100 unit/mL Subcutaneous Insulin Pen Take up to 50 units nightly    insulin lispro (HUMALOG KWIKPEN INSULIN) 100 unit/mL Subcutaneous Insulin Pen Take up to 30 units three times a day as instructed    Insulin Needles, Disposable, (PEN NEEDLE) 32 gauge x 5/32" Needle To inject 4 times per day    lancets (ONE TOUCH DELICA) 33 gauge Misc To check sugars 4 times daily as directed    lisinopriL (PRINIVIL) 2.5 mg Oral Tablet Take 1 Tab (2.5 mg total) by mouth Once a day    metFORMIN (GLUCOPHAGE XR) 500 mg Oral Tablet Sustained Release 24 hr Take 1 Tab (500 mg total) by mouth Every night    naltrexone-bupropion (CONTRAVE) 8-90 mg Oral Tablet Sustained Release Take 1 tablets twice daily.    semaglutide (OZEMPIC) 1 mg/dose (2 mg/1.5 mL) Subcutaneous Pen Injector 1 mg by Subcutaneous route Every 7 days    vits A,C,E/zinc/copper (VISION-VITE PRESERVE ORAL) Take 1 Tab by mouth Twice daily     Last  dept visit:  03/04/2019  Next pending dept visit: 09/09/2019  Pending metformin refill  Caroline Sauger, RN 07/07/2019, 09:55

## 2019-08-10 ENCOUNTER — Other Ambulatory Visit (HOSPITAL_BASED_OUTPATIENT_CLINIC_OR_DEPARTMENT_OTHER): Payer: Self-pay | Admitting: "Endocrinology

## 2019-08-10 DIAGNOSIS — E109 Type 1 diabetes mellitus without complications: Secondary | ICD-10-CM

## 2019-08-10 MED ORDER — INSULIN LISPRO (U-100) 100 UNIT/ML SUBCUTANEOUS PEN
PEN_INJECTOR | SUBCUTANEOUS | 3 refills | Status: DC
Start: 2019-08-10 — End: 2019-09-09

## 2019-08-10 NOTE — Telephone Encounter (Signed)
Regarding: pt out of rx  ----- Message from Oneida Arenas sent at 08/10/2019  2:48 PM EDT -----  Doctor Name:  Zadie Rhine, MD       Date of last appointment: 03/04/2019    Next scheduled visit: 09/09/19    Medication Requested:     insulin lispro (HUMALOG KWIKPEN INSULIN) 100 unit/mL Subcutaneous Insulin Pen      Preferred Pharmacy     [id number] [address]    [address] [place] [address]    Phone: [phone] Fax: [phone]    Hours: Not open 24 hours

## 2019-08-10 NOTE — Telephone Encounter (Signed)
Current Outpatient Medications   Medication Sig   . aspirin (ECOTRIN) 81 mg Oral Tablet, Delayed Release (E.C.) Take 81 mg by mouth Once a day   . atorvastatin (LIPITOR) 20 mg Oral Tablet Take 1 Tab (20 mg total) by mouth Once a day   . Blood Sugar Diagnostic (ONETOUCH VERIO) Strip To check sugars 4 times daily as directed   . cholecalciferol, Vitamin D3, (VITAMIN D-3) 5,000 unit Oral Tablet Take 5,000 Units by mouth Once a day   . empagliflozin (JARDIANCE) 25 mg Oral Tablet Take 1 tablet daily by mouth.   . flash glucose scanning reader (FREESTYLE LIBRE 14 DAY READER) Does not apply Misc To test blood sugars E10.9   . flash glucose sensor (FREESTYLE LIBRE 14 DAY SENSOR) Does not apply Kit To test blood sugars E10.9   . gabapentin (NEURONTIN) 300 mg Oral Capsule Take 1 Cap (300 mg total) by mouth Every night   . glucagon (GLUCAGEN) 1 mg/mL Injection Recon Soln 1 mg by Intramuscular route Once, as needed for Other (Blood sugar less than 70 and cannot eat) for up to 1 dose   . insulin glargine (LANTUS SOLOSTAR U-100 INSULIN) 100 unit/mL Subcutaneous Insulin Pen Take up to 50 units nightly   . insulin lispro (HUMALOG KWIKPEN INSULIN) 100 unit/mL Subcutaneous Insulin Pen Take up to 30 units three times a day as instructed   . Insulin Needles, Disposable, (PEN NEEDLE) 32 gauge x 5/32" Needle To inject 4 times per day   . lancets (ONE TOUCH DELICA) 33 gauge Misc To check sugars 4 times daily as directed   . lisinopriL (PRINIVIL) 2.5 mg Oral Tablet Take 1 Tab (2.5 mg total) by mouth Once a day   . metFORMIN (GLUCOPHAGE XR) 500 mg Oral Tablet Sustained Release 24 hr TAKE 1 TAB (500 MG TOTAL) BY MOUTH EVERY NIGHT   . naltrexone-bupropion (CONTRAVE) 8-90 mg Oral Tablet Sustained Release Take 1 tablets twice daily.   . semaglutide (OZEMPIC) 1 mg/dose (2 mg/1.5 mL) Subcutaneous Pen Injector 1 mg by Subcutaneous route Every 7 days   . vits A,C,E/zinc/copper (VISION-VITE PRESERVE ORAL) Take 1 Tab by mouth Twice daily     Last  dept visit:  03/04/2019  Next pending dept visit: 09/09/2019  Pending humalog  Caroline Sauger, RN 08/10/2019, 14:52

## 2019-08-17 ENCOUNTER — Other Ambulatory Visit (HOSPITAL_BASED_OUTPATIENT_CLINIC_OR_DEPARTMENT_OTHER): Payer: Self-pay | Admitting: "Endocrinology

## 2019-08-17 ENCOUNTER — Ambulatory Visit (HOSPITAL_BASED_OUTPATIENT_CLINIC_OR_DEPARTMENT_OTHER): Payer: Self-pay | Admitting: "Endocrinology

## 2019-08-17 DIAGNOSIS — E041 Nontoxic single thyroid nodule: Secondary | ICD-10-CM

## 2019-08-17 NOTE — Telephone Encounter (Signed)
Please let patient know that she had a palpable right thyroid nodule in 2018 and we got an ultrasound at that time which did not show any nodules. I will order a repeat thyroid ultrasound for her to get done when she gets back from [place] to see if there are any new nodules.     Gordy Levan, MD, MPH 08/17/2019, 16:07

## 2019-08-17 NOTE — Telephone Encounter (Signed)
Routing to provider to advise. Pt has appt 09/09/19. Leomar Westberg, RN  08/17/2019, 14:40

## 2019-08-17 NOTE — Telephone Encounter (Signed)
Spoke with patient regarding provider's below message, patient verbalized understanding.   Chasmine Lender, RN  08/17/2019, 16:09

## 2019-08-17 NOTE — Telephone Encounter (Signed)
Regarding: pt was in ER, told needed to have [place]  ----- Message from Hezzie Bump sent at 08/17/2019  2:23 PM EDT -----  Zadie Rhine, MD    Patient called in stating that she was in the ER while in [place], and was told that the dr felt several nodules and the ER doctor told her she definitly needed to have an US done.  Please call to advise.  Thank you!

## 2019-08-31 ENCOUNTER — Ambulatory Visit (HOSPITAL_BASED_OUTPATIENT_CLINIC_OR_DEPARTMENT_OTHER): Payer: Self-pay | Admitting: "Endocrinology

## 2019-08-31 NOTE — Telephone Encounter (Signed)
Routing to diabetic educators to advise.Christie Nottingham, RN  08/31/2019, 14:55

## 2019-08-31 NOTE — Telephone Encounter (Signed)
Regarding: pump  ----- Message from Hoy Morn sent at 08/31/2019  2:48 PM EDT -----  Zadie Rhine, MD    Patient states that you had talked to her about a pump two years ago and she didn't feel like she needed one. Now she feels like she does. She needs one that allows her to swim up to 2 hrs at a time. She would like options that work with her insurance. She wanted to get this started before her upcoming appt with you on 6/17. Please call pt to advise.

## 2019-09-02 NOTE — Progress Notes (Signed)
[facility]  RETURN VISIT    Chief Complaint:  LADA/Type I DM  Referring Provider: Jobe Gibbon, FNP-BC  PCP: Jobe Gibbon, FNP-BC     HPI:   Isabella Terrell is a 64 y.o. female with PMH of HTN, HLD, peripheral neuropathy, and diabetic retinopathy who presents for follow up of LADA/type I diabetes mellitus.     Past history:  -Diagnosed with type II diabetes mellitus about 25 years ago after presenting to her PCP with polyuria  -Has never been hospitalized for high or low sugars. Has never been in DKA  -Was having multiple UTI and yeast infections so her gynecologist checked her HgA1C on 11/21/16 which was elevated to 12.6%. No longer having frequent UTIs  -When established here, GAD and islet cell antibodies were checked to evaluate for type I DM with GAD antibodies coming back positive likely indicating LADA however patient also has insulin resistance so currently treating as both type I and type II  -She has a [employer]    I uploaded, reviewed, interpreted, and made recommendations based on patient's CGM data.  -In target 52%, high 33%, very high 14%, low 1%, very low 0%      -Most recent HgA1C:  Lab Results   Component Value Date    HA1C 7.6 (H) 03/04/2019     CURRENT ANTI-DIABETIC REGIMEN:  Patient is adherent to meds   Lantus 20 units qPM   Humalog about about 24 units with lunch/dinner (in [place], breakfast/dinner)   Ozempic 1 mg weekly   Jardiance 25 mg daily   Metformin-XR 500 mg daily     PREVIOUS ANTI-DIABETIC MEDS:   Januvia- D/Ced to switch to insulin   Metformin (IR)- D/Ced to switch to insulin     BLOOD GLUCOSE TRENDS: Testing BG 10-15 times per day via Freestyle Libre  Time Readings   Before Breakfast 100-140s   Before Lunch 160-200s   Before Supper 180-250s   Bedtime  180-250s     Seeing some sugars in the 300s usually in the evenings.      HYPOGLYCEMIA EVALUATION:  Is it occurring? Symptoms? Had about 4-5 in May, once in last few weeks when had  company; symptoms include feeling weak   Hypoglycemic Event: (Date/Time) About once every 2 weeks   Treatment: Food, juice   Assisted or Unassisted: Unassisted   Hypoglycemic unawareness? No; feels weird when sugars in 70s   -Glucagon kit: Yes    Has been traveling and getting ready for [place] (leaving August 7th). Pharmacy would not fill Contrave while was in [place]; has been off of it for about 5 months.     DIABETIC COMPLICATIONS:  Microvascular Complications:    Peripheral/Autonomic Neuropathy: +Mild peripheral neuropathy; numbness on tops of feet past few months, cramping in bottom of right, On Neurontin qPM   Retinopathy: Last eye exam 07/2019. Has diabetic retinopathy requiring eye injections. Her current eye doctor here had open heart surgery; has an eye doctor in [place] but needs a new one here   Renal: Last GFR:   Lab Results   Component Value Date    GFR >60 03/04/2019      Microalb/crt ratio:  Urine Microalb/cr ratio   Lab Results   Component Value Date/Time    MICALBCRERAT 7.9 03/04/2019 01:08 PM         On ACEI/ARB: Lisinopril 2.5 mg daily    Macrovascular Complications:   Hx of CAD: No   Hx of statin/ASA: On  Lipitor, no aspirin; SGLT2 or GLP-1 therapy? No SGLT-2; currently on Ozempic and Jardiance   Hx of Stroke: No   Hx of HTN: Yes   Hx of HLD: Yes    Has lost about 5 lbs since last year; weight stable since last appointment.   Wt Readings from Last 15 Encounters:   09/09/19 78 kg (171 lb 15.3 oz)   03/04/19 77.6 kg (171 lb 1.2 oz)   09/08/18 79.7 kg (175 lb 11.3 oz)   03/03/18 80.1 kg (176 lb 9.4 oz)   11/11/17 76.6 kg (168 lb 14 oz)   07/25/17 74.6 kg (164 lb 7.4 oz)   07/15/17 74.9 kg (165 lb 2 oz)   03/11/17 74.7 kg (164 lb 10.9 oz)   01/16/17 69.9 kg (154 lb)   01/16/17 70.3 kg (154 lb 15.7 oz)   12/17/16 71.1 kg (156 lb 12 oz)   10/12/12 67.5 kg (148 lb 13 oz)   09/23/12 68.9 kg (152 lb)     REVIEW OF SYSTEMS:  Positive for some numbness/tingling in legs, difficulty losing  weight.  In addition to HPI...  Constitutional: negative  Eyes: negative  Ears, nose, mouth, throat, and face: negative  Respiratory: negative  Cardiovascular: negative  Gastrointestinal: negative  Genitourinary:negative  Integument/breast: negative  Hematologic/lymphatic: negative  Musculoskeletal:negative  Neurological: Positive for headache, lower extremity numbness/tingling  Behavioral/Psych: negative  Endocrine: negative  Allergic/Immunologic: negative  All other ROS Negative    Problem List:  LADA  Hypertension  Hyperlipidemia  Difficulty Losing Weight    Past Medical History:   Diagnosis Date    Diabetic retinopathy (CMS HCC)     HLD (hyperlipidemia)     HTN (hypertension)     LADA (latent autoimmune diabetes in adults), managed as type 1 (CMS HCC)      Past Surgical History:   Procedure Laterality Date    ENDOMETRIAL ABLATION      HX TONSILLECTOMY      HX WISDOM TEETH EXTRACTION      TRIGGER FINGER RELEASE       Social History     Tobacco Use    Smoking status: Never Smoker    Smokeless tobacco: Never Used   Substance Use Topics    Alcohol use: No    Drug use: No        Family Medical History:     Problem Relation (Age of Onset)    Coronary Artery Disease Mother    Diabetes Father    Diabetes type II Father, Brother, Paternal Uncle, Sister    Hypothyroidism Mother, Sister    Multiple Sclerosis Brother    Other Mother, Father          Current Outpatient Medications   Medication Sig    aspirin (ECOTRIN) 81 mg Oral Tablet, Delayed Release (E.C.) Take 81 mg by mouth Once a day    atorvastatin (LIPITOR) 20 mg Oral Tablet Take 1 Tab (20 mg total) by mouth Once a day    Blood Sugar Diagnostic (ONETOUCH VERIO) Strip To check sugars 4 times daily as directed    cholecalciferol, Vitamin D3, (VITAMIN D-3) 5,000 unit Oral Tablet Take 5,000 Units by mouth Once a day    empagliflozin (JARDIANCE) 25 mg Oral Tablet Take 1 tablet daily by mouth.    flash glucose scanning reader (FREESTYLE LIBRE 14 DAY  READER) Does not apply Misc To test blood sugars E10.9    flash glucose sensor (FREESTYLE LIBRE 14 DAY SENSOR) Does not apply Kit To  test blood sugars E10.9    fluconazole (DIFLUCAN) 100 mg Oral Tablet Take 1 Tablet (100 mg total) by mouth Once a day    gabapentin (NEURONTIN) 400 mg Oral Capsule Take 1 Capsule (400 mg total) by mouth Every night    glucagon (GLUCAGEN) 1 mg/mL Injection Recon Soln 1 mg by Intramuscular route Once, as needed for Other (Blood sugar less than 70 and cannot eat) for up to 1 dose    insulin glargine (LANTUS SOLOSTAR U-100 INSULIN) 100 unit/mL Subcutaneous Insulin Pen Take up to 50 units nightly    insulin lispro (HUMALOG KWIKPEN INSULIN) 100 unit/mL Subcutaneous Insulin Pen Inject up to 40 units daily in 3 divided doses.    insulin pen needles (PEN NEEDLE) 32 gauge x 5/32" Needle To inject 4 times per day    lancets (ONE TOUCH DELICA) 33 gauge Misc To check sugars 4 times daily as directed    lisinopriL (PRINIVIL) 2.5 mg Oral Tablet Take 1 Tablet (2.5 mg total) by mouth Once a day    metFORMIN (GLUCOPHAGE XR) 500 mg Oral Tablet Sustained Release 24 hr Take 4 Tablets (2,000 mg total) by mouth Every night    naltrexone-bupropion (CONTRAVE) 8-90 mg Oral Tablet Sustained Release Take 1 Tablet by mouth Twice daily    omeprazole (PRILOSEC) 40 mg Oral Capsule, Delayed Release(E.C.) Take 40 mg by mouth Once a day    semaglutide (OZEMPIC) 1 mg/dose (2 mg/1.5 mL) Subcutaneous Pen Injector 1 mg by Subcutaneous route Every 7 days    vits A,C,E/zinc/copper (VISION-VITE PRESERVE ORAL) Take 1 Tab by mouth Twice daily     No Known Allergies    PHYSICAL EXAM:   Vitals:    09/09/19 0807   BP: 120/68   Pulse: 88   Temp: 35.5 C (95.9 F)   SpO2: 99%   Weight: 78 kg (171 lb 15.3 oz)   Height: 1.549 m (5' 1")   BMI: 32.56      General: Well appearing female in no acute distress.  Psychiatric: Normal mood and affect  Neuro: Alert and oriented x 3. Speech fluent. Cranial nerves II-XII intact. No  focal deficits noted.   HEENT: Head normocephalic, atraumatic. PERRLA, EOMI, conjunctiva clear. Wearing a face mask.   Thyroid/Neck: Trachea midline  Lungs: Normal respiratory effort.   Cardiac: Regular rate and rhythm  Abdomen: Non-distended.   Extremities: No edema or erythema.  Skin: No visible rashes.  Diabetic foot exam:  No edema bilaterally. No ulcerations or lesions bilaterally. Sensation normal bilaterally.         LABS:  Lab Results   Component Value Date/Time    HA1C 7.6 (H) 03/04/2019 01:08 PM    SODIUM 141 03/04/2019 01:08 PM    POTASSIUM 3.9 03/04/2019 01:08 PM    CHLORIDE 102 03/04/2019 01:08 PM    CO2 30 03/04/2019 01:08 PM    BUN 12 03/04/2019 01:08 PM    CREATININE 0.78 03/04/2019 01:08 PM    MICALBCRERAT 7.9 03/04/2019 01:08 PM    AST 31 08/28/2018 09:10 AM    ALT 41 08/28/2018 09:10 AM     12/24/2016 1:28 PM - Edi, Reference Lab Results In      Component Results     Component Value Ref Range & Units Status    GAD65 AB ASSAY 0.12 (H) <=0.02 nmol/L Final     12/24/2016 1:30 PM - Edi, Reference Lab Results In      Component Results     Component Value Ref Range &  Units Status    ISLET ANTIGEN 2 (IA-2) ANTIBODY, SERUM 0.00 <=0.02 nmol/L Final      Ref. Range 12/17/2016 14:28 01/16/2017 10:04 03/03/2018 12:51   TSH Latest Ref Range: 0.350 - 5.000 uIU/mL 1.887  1.247   INTACT PTH Latest Ref Range: 8.5 - 75.0 pg/mL  57.2      ASSESSMENT:  Isabella Terrell is a 64 y.o.   female with PMH of HTN, HLD, peripheral neuropathy, and diabetic retinopathy who presents for follow up of LADA/type I diabetes mellitus.      PLAN:  LADA/Type I DM with Insulin Resistance complicated by Frequent Evening Hyperglycemia  -Complicated by frequent evening hyperglycemia, rare hypoglycemia, mild peripheral neuropathy, past microalbuminuria (now normal as of 2018), diabetic retinopathy and macrovascular diseases including HTN, HLD  -Anti-GAD and anti-islet cell antibodies to evaluate for type 1 DM checked and positive for  GAD antibody however patient also has insulin resistance and requires higher doses of insulin which has contributed to weight gain  -Will check C-peptide and glucose level today to evaluate pancreatic function; will consider an insulin pump in the future if needed to help control blood sugars further  -Most recent HgA1C 7.6% in 02/2019; will repeat today. Urine microalbumin/creatinine ratio 0.6 in 02/2019. Currently on lisinopril 2.5 mg daily  -Annual diabetes foot exam done today, 09/09/2019; will refer to Podiatry as patient requests assistance with cutting toenails in setting of peripheral neuropathy  -Most recent diabetic eye exam 07/2019. Has diabetic retinopathy requiring eye injections. Her current eye doctor retired; will refer to [employer]  -Hypoglycemia rarely occuring. Has glucagon injection. Discussed patient with diabetes educator nurse, Elizabeth Palau, who instructed her husband, Richardson Landry, how to use this. Treats with food, juice  -Reviewed blood sugars via Libre CGM download. AM and before bedtime sugars well-controlled but having hyperglycemia later in the day  -Continue Lantus 20 units qPM. Will increase Humalog to 28 units BID-AC with two biggest meals of the day  -Patient likely has insulin resistance as she requires higher doses of insulin. Since she never went into DKA, she likely has some pancreatic beta cell function. Checking C-peptide as above. Continue Ozempic 1 mg weekly and Jardiance 25 mg daily to help decrease the amount of insulin requirements to help with glucose control and weight loss. Will also increase metformin-XR 500 mg nightly to help with insulin resistance and try to decrease the amount of insulin requirements; instructed her to increase to 1000 mg nightly x 1 week then increase to 1500 mg nightly x 1 week then increase to 2000 mg nightly to stay on that dose  -Has glucometer, test strips, lancets. Refilled pen needles and Libre sensors  -Most recent BMP 02/2019 with  creatinine 0.78 and GFR >60, will repeat today  -Continue to check sugars 4x/day via [place] and to bring sugar logs and glucometer to clinic appointments    Peripheral Neuropathy  -Will increase Neurontin to 400 mg qPM  -Referred to Podiatry for assistance with cutting toenails in setting of neuropathy     Hgba1c Goal <7%   Preprandial Glucose Target 80-140 mg/dL   Peak Postprandial Glucose Target <180 mg/dL    BP Goal: <130/80    Labs today: HgA1C, BMP, TSH, C-peptide   Vaccines:   Flu vaccine annually- discuss with PCP   Hepatitis B- discuss with PCP    Hepatitis B, 3-dose series to unvaccinated adults with DM>60 y/o    Hepatitis B, 3-dose series to unvaccinated adults with DM  56-59 y/o    Pneumonia- discuss with PCP    All patients with DM 2-64 y/o with pneumococcal polysaccharide vaccine (PPSV23)   At age 80 years, administer the pneumococcal conjugate vaccine (PCV 13) at least 1 year after vaccination with PPSV23, followed by another dose of vaccine PPSV23 at least 1 year after the last dose of PPSV23    Yearly eye and daily foot exams discussed   Symptoms of potential hypoglycemia discussed and treatment of hypoglycemia discussed   Return visit in 6 months or earlier if needed    The patient will be contacted once any lab work is resulted    CC: Notes to referring physician      Palpable Right Thyroid Nodule  -TSH WNL; will repeat. Thyroid ultrasound done 03/06/17 without thyroid nodules with homogenous thyroid texture  -ED at outside facility noted thyroid nodules so has an ultrasound tomorrow    Difficulty Losing Weight  -Discussed continuing intermittent fasting (eating from 10 AM to 6 PM) as patient is doing well with it  -Have previously discussed weight loss medications including GLP-1 agonists (Victoza, Ozempic, or Saxenda), Phentermine, Topamax  -Patient is already on full dose Ozempic without weight loss. Did not want to start phentermine as she was having anxiety and was  worried it would make it worse  -D/Ced Topamax as did not have effect. Continue Contrave 8-90 mg to 2 tabs BID (has been off for about 5 months since outside pharmacy would not fill; will restart)  -Patient did not find seeing a dietitian helpful in the past so will not refer at this time  -Continue incorporating exercise into daily routine such as walking, fitness classes, low-weight exercise machines or free weights    Yeast Infection  -Sent prescription for Diflucan    Orders Placed This Encounter    Hemoglobin Q2W    Basic Metabolic Panel    C-PEPTIDE    THYROID STIMULATING HORMONE (SENSITIVE TSH)    Refer to [facility] [place]    Refer to [facility]    naltrexone-bupropion (CONTRAVE) 8-90 mg Oral Tablet Sustained Release    metFORMIN (GLUCOPHAGE XR) 500 mg Oral Tablet Sustained Release 24 hr    semaglutide (OZEMPIC) 1 mg/dose (2 mg/1.5 mL) Subcutaneous Pen Injector    empagliflozin (JARDIANCE) 25 mg Oral Tablet    flash glucose sensor (FREESTYLE LIBRE 14 DAY SENSOR) Does not apply Kit    gabapentin (NEURONTIN) 400 mg Oral Capsule    insulin glargine (LANTUS SOLOSTAR U-100 INSULIN) 100 unit/mL Subcutaneous Insulin Pen    insulin lispro (HUMALOG KWIKPEN INSULIN) 100 unit/mL Subcutaneous Insulin Pen    insulin pen needles (PEN NEEDLE) 32 gauge x 5/32" Needle    lisinopriL (PRINIVIL) 2.5 mg Oral Tablet    fluconazole (DIFLUCAN) 100 mg Oral Tablet     Keith Rake, MD, MPH 09/09/2019, 09:18

## 2019-09-09 ENCOUNTER — Ambulatory Visit: Payer: No Typology Code available for payment source | Attending: "Endocrinology | Admitting: "Endocrinology

## 2019-09-09 ENCOUNTER — Ambulatory Visit (HOSPITAL_BASED_OUTPATIENT_CLINIC_OR_DEPARTMENT_OTHER): Payer: No Typology Code available for payment source

## 2019-09-09 ENCOUNTER — Encounter (HOSPITAL_BASED_OUTPATIENT_CLINIC_OR_DEPARTMENT_OTHER): Payer: Self-pay | Admitting: "Endocrinology

## 2019-09-09 VITALS — BP 120/68 | HR 88 | Temp 95.9°F | Ht 61.0 in | Wt 172.0 lb

## 2019-09-09 DIAGNOSIS — E139 Other specified diabetes mellitus without complications: Secondary | ICD-10-CM

## 2019-09-09 DIAGNOSIS — B379 Candidiasis, unspecified: Secondary | ICD-10-CM | POA: Insufficient documentation

## 2019-09-09 DIAGNOSIS — R638 Other symptoms and signs concerning food and fluid intake: Secondary | ICD-10-CM | POA: Insufficient documentation

## 2019-09-09 DIAGNOSIS — E11319 Type 2 diabetes mellitus with unspecified diabetic retinopathy without macular edema: Secondary | ICD-10-CM | POA: Insufficient documentation

## 2019-09-09 LAB — BASIC METABOLIC PANEL
ANION GAP: 11 mmol/L (ref 4–13)
BUN/CREA RATIO: 11 (ref 6–22)
BUN: 9 mg/dL (ref 8–25)
CALCIUM: 10.2 mg/dL (ref 8.8–10.2)
CHLORIDE: 104 mmol/L (ref 96–111)
CO2 TOTAL: 28 mmol/L (ref 23–31)
CREATININE: 0.79 mg/dL (ref 0.60–1.05)
ESTIMATED GFR: 80 mL/min/BSA (ref >=60)
GLUCOSE: 233 mg/dL — ABNORMAL HIGH (ref 65–125)
POTASSIUM: 4.4 mmol/L (ref 3.5–5.1)
SODIUM: 143 mmol/L (ref 136–145)

## 2019-09-09 LAB — THYROID STIMULATING HORMONE (SENSITIVE TSH): TSH: 1.853 u[IU]/mL (ref 0.430–3.550)

## 2019-09-09 MED ORDER — FLUCONAZOLE 100 MG TABLET
100.00 mg | ORAL_TABLET | Freq: Every day | ORAL | 0 refills | Status: DC
Start: 2019-09-09 — End: 2020-03-23

## 2019-09-09 MED ORDER — CONTRAVE 8 MG-90 MG TABLET,EXTENDED RELEASE
1.00 | ORAL_TABLET | Freq: Two times a day (BID) | ORAL | 2 refills | Status: DC
Start: 2019-09-09 — End: 2020-03-23

## 2019-09-09 MED ORDER — LISINOPRIL 2.5 MG TABLET
2.50 mg | ORAL_TABLET | Freq: Every day | ORAL | 4 refills | Status: DC
Start: 2019-09-09 — End: 2020-04-26

## 2019-09-09 MED ORDER — OZEMPIC 1 MG/DOSE (2 MG/1.5 ML) SUBCUTANEOUS PEN INJECTOR
1.00 mg | PEN_INJECTOR | SUBCUTANEOUS | 3 refills | Status: DC
Start: 2019-09-09 — End: 2020-03-23

## 2019-09-09 MED ORDER — FREESTYLE LIBRE 14 DAY SENSOR KIT
PACK | 3 refills | Status: DC
Start: 2019-09-09 — End: 2020-06-19

## 2019-09-09 MED ORDER — GABAPENTIN 400 MG CAPSULE
400.00 mg | ORAL_CAPSULE | Freq: Every evening | ORAL | 1 refills | Status: DC
Start: 2019-09-09 — End: 2020-02-04

## 2019-09-09 MED ORDER — EMPAGLIFLOZIN 25 MG TABLET
ORAL_TABLET | ORAL | 3 refills | Status: DC
Start: 2019-09-09 — End: 2020-10-25

## 2019-09-09 MED ORDER — LANTUS SOLOSTAR U-100 INSULIN 100 UNIT/ML (3 ML) SUBCUTANEOUS PEN
PEN_INJECTOR | SUBCUTANEOUS | 2 refills | Status: DC
Start: 2019-09-09 — End: 2021-01-12

## 2019-09-09 MED ORDER — PEN NEEDLE, DIABETIC 32 GAUGE X 5/32"
3 refills | Status: AC
Start: 2019-09-09 — End: ?

## 2019-09-09 MED ORDER — INSULIN LISPRO (U-100) 100 UNIT/ML SUBCUTANEOUS PEN
PEN_INJECTOR | SUBCUTANEOUS | 3 refills | Status: DC
Start: 2019-09-09 — End: 2021-10-05

## 2019-09-09 MED ORDER — METFORMIN ER 500 MG TABLET,EXTENDED RELEASE 24 HR
2000.00 mg | ORAL_TABLET | Freq: Every evening | ORAL | 3 refills | Status: DC
Start: 2019-09-09 — End: 2020-10-10

## 2019-09-09 NOTE — Patient Instructions (Addendum)
For your metformin, increase to 1000 mg nightly for 1 week. If you do well with that, increase to 1500 mg nightly for 1 week. If you do well on that dose, increase to 2000 mg nightly.    Continue Lantus 20 units nightly. Increase Humalog slightly to 28 units with your two biggest meals of the day.    Continue Ozempic 1 mg weekly and Jardiance 25 mg daily.

## 2019-09-10 ENCOUNTER — Other Ambulatory Visit: Payer: Self-pay

## 2019-09-10 ENCOUNTER — Other Ambulatory Visit (INDEPENDENT_AMBULATORY_CARE_PROVIDER_SITE_OTHER): Payer: No Typology Code available for payment source

## 2019-09-10 ENCOUNTER — Ambulatory Visit (HOSPITAL_BASED_OUTPATIENT_CLINIC_OR_DEPARTMENT_OTHER): Payer: Self-pay | Admitting: "Endocrinology

## 2019-09-10 DIAGNOSIS — E041 Nontoxic single thyroid nodule: Secondary | ICD-10-CM

## 2019-09-10 LAB — HGA1C (HEMOGLOBIN A1C WITH EST AVG GLUCOSE)
ESTIMATED AVERAGE GLUCOSE: 183 mg/dL
HEMOGLOBIN A1C: 8 % — ABNORMAL HIGH (ref 4.0–5.6)

## 2019-09-10 NOTE — Telephone Encounter (Signed)
Regarding: [place] results.   ----- Message from Thurmond Butts sent at 09/10/2019  2:43 PM EDT -----  Zadie Rhine, MD    Pt called in stating that she had a message on her MyChart about [place] results but could not access it. Please call pt to advise. thank you.

## 2019-09-10 NOTE — Telephone Encounter (Signed)
Attempted to return call, no answer. Left message to call back. Sent MyChart.Christie Nottingham, RN  09/10/2019, 14:47

## 2019-09-10 NOTE — Telephone Encounter (Signed)
Message from [name] sent at 09/10/2019 3:39 PM EDT    Summary: Returning call    Isabella Terrell     Pt is returning your call for her test results. She stated she would appreciate a call back before the clinic closes today. She doesn't have access to her MyChart at this time. Please call pt to advise. Thank you!               Spoke with patient regarding provider's below message, patient verbalized understanding. Ilyaas Musto, RN  09/10/2019, 15:45

## 2019-09-14 ENCOUNTER — Telehealth (HOSPITAL_BASED_OUTPATIENT_CLINIC_OR_DEPARTMENT_OTHER): Payer: Self-pay | Admitting: "Endocrinology

## 2019-09-14 LAB — C-PEPTIDE: C-PEPTIDE: 2 ng/mL (ref 0.9–7.1)

## 2019-09-14 NOTE — Nursing Note (Signed)
Prior authorization completed for Contrave via cover my meds, awaiting approval.     Esmeralda Links  09/14/2019, 13:33

## 2019-09-15 ENCOUNTER — Encounter (HOSPITAL_BASED_OUTPATIENT_CLINIC_OR_DEPARTMENT_OTHER): Payer: Self-pay | Admitting: "Endocrinology

## 2019-09-15 NOTE — Nursing Note (Signed)
Received denial of coverage for Contrave. PT was denied due to not losing 5% of body weight.    Isabella Terrell  09/15/2019, 14:45

## 2019-09-21 ENCOUNTER — Ambulatory Visit (HOSPITAL_BASED_OUTPATIENT_CLINIC_OR_DEPARTMENT_OTHER)
Admission: RE | Admit: 2019-09-21 | Discharge: 2019-09-21 | Disposition: A | Discharge status: Home / Self Care | Payer: No Typology Code available for payment source | Source: Ambulatory Visit

## 2019-09-21 ENCOUNTER — Encounter (INDEPENDENT_AMBULATORY_CARE_PROVIDER_SITE_OTHER): Payer: Self-pay | Admitting: Ophthalmology

## 2019-09-21 ENCOUNTER — Ambulatory Visit
Admission: RE | Admit: 2019-09-21 | Discharge: 2019-09-21 | Disposition: A | Discharge status: Home / Self Care | Payer: No Typology Code available for payment source | Source: Ambulatory Visit | Attending: Ophthalmology | Admitting: Ophthalmology

## 2019-09-21 ENCOUNTER — Ambulatory Visit (INDEPENDENT_AMBULATORY_CARE_PROVIDER_SITE_OTHER): Payer: No Typology Code available for payment source | Admitting: Ophthalmology

## 2019-09-21 ENCOUNTER — Other Ambulatory Visit: Payer: Self-pay

## 2019-09-21 DIAGNOSIS — Z961 Presence of intraocular lens: Secondary | ICD-10-CM

## 2019-09-21 DIAGNOSIS — H35372 Puckering of macula, left eye: Secondary | ICD-10-CM

## 2019-09-21 DIAGNOSIS — H538 Other visual disturbances: Secondary | ICD-10-CM | POA: Insufficient documentation

## 2019-09-21 DIAGNOSIS — E113293 Type 2 diabetes mellitus with mild nonproliferative diabetic retinopathy without macular edema, bilateral: Secondary | ICD-10-CM

## 2019-09-21 DIAGNOSIS — E113213 Type 2 diabetes mellitus with mild nonproliferative diabetic retinopathy with macular edema, bilateral: Secondary | ICD-10-CM

## 2019-09-21 DIAGNOSIS — H35342 Macular cyst, hole, or pseudohole, left eye: Secondary | ICD-10-CM

## 2019-09-21 DIAGNOSIS — E139 Other specified diabetes mellitus without complications: Secondary | ICD-10-CM | POA: Insufficient documentation

## 2019-09-21 MED ORDER — BEVACIZUMAB 1.25 MG/ 0.05 ML INTRAVITREAL INJ.
1.25 mg | INJECTION | Freq: Once | INTRAVITREAL | 0 refills | Status: AC
Start: 2019-09-22 — End: 2019-09-22

## 2019-09-21 MED ORDER — AFLIBERCEPT 2 MG/0.05 ML INTRAVITREAL SOLUTION FOR INJECTION
INTRAVITREAL | Status: AC
Start: 2019-09-21 — End: 2019-09-21
  Filled 2019-09-21: qty 0.1

## 2019-09-21 NOTE — Progress Notes (Addendum)
[facility]  [address]  [place] [address]  Operated by [facility]         Patient Name: Isabella Terrell  MRN#: [id number]  Birthdate: 1955/08/07    Date of Service: Terrell    Chief Complaint     New Patient          Isabella Terrell is a 64 y.o. female who presents today for evaluation/consultation of:  HPI     Pt here for NPV- referral from Dr. Drusilla Terrell- former Dr. Jobe Terrell   Pt states she is leaving in august to go back down to [place] where she also has a house- pt states she sees Isabellagrahamarami ? Another retinal specialist   Pt has hx of vitrectomy OS 8.9.2019- Dr. Jobe Terrell   Pt states va is unstable   Pt states shes waiting for new glasses when she goes back down to [place]   Pt has occ floaters- they come and go   Pt denies any eye pain   Pt denies using any gtts   Pt states she used to use AREDS and her Dr told her it was for AMD and she didn't need to use them   BS 97 or 111- pt cant remember   Lab Results       Component                Value               Date                       HA1C                     8.0 (H)             09/09/2019                Last edited by Isabella Terrell on Terrell 10:29 AM. (History)        ROS     Positive for: Endocrine, Eyes    Negative for: Constitutional, Gastrointestinal, Neurological, Skin, Genitourinary, Musculoskeletal, HENT, Cardiovascular, Respiratory, Psychiatric, Allergic/Imm, Heme/Lymph    Last edited by Isabella Terrell on Terrell 10:29 AM. (History)         All other systems Negative    Isabella Terrell Terrell, 23:23       Past Surgical History:   Procedure Laterality Date   . ENDOMETRIAL ABLATION     . HX TONSILLECTOMY     . HX WISDOM TEETH EXTRACTION     . TRIGGER FINGER RELEASE         Past Medical History:   Diagnosis Date   . Diabetic retinopathy (CMS HCC)    . HLD (hyperlipidemia)    . HTN (hypertension)    . LADA (latent autoimmune diabetes in adults), managed as type 1 (CMS HCC)        There  is no problem list on file for this patient.      Family History:  Family Medical History:     Problem Relation (Age of Onset)    Coronary Artery Disease Mother    Diabetes Father    Diabetes type II Father, Brother, Paternal Uncle, Sister    Hypothyroidism Mother, Sister    Multiple Sclerosis Brother    Other Mother, Father        Social History:     Social History  Socioeconomic History   . Marital status: Married     Spouse name: Not on file   . Number of children: Not on file   . Years of education: Not on file   . Highest education level: Not on file   Occupational History   . Occupation: semi-retired Lawyer   Tobacco Use   . Smoking status: Never Smoker   . Smokeless tobacco: Never Used   Substance and Sexual Activity   . Alcohol use: No   . Drug use: No   . Sexual activity: Yes     Partners: Male   Other Topics Concern   . Uses Cane No   . Uses walker No   . Uses wheelchair No   . Right hand dominant Yes   Social History Narrative    No illicit drug use.     Social Determinants of Health     Financial Resource Strain:    . Difficulty of Paying Living Expenses:    Food Insecurity:    . Worried About Programme researcher, broadcasting/film/video in the Last Year:    . Barista in the Last Year:    Transportation Needs:    . Freight forwarder (Medical):    Marland Kitchen Lack of Transportation (Non-Medical):    Physical Activity:    . Days of Exercise per Week:    . Minutes of Exercise per Session:    Stress:    . Feeling of Stress :    Intimate Partner Violence:    . Fear of Current or Ex-Partner:    . Emotionally Abused:    Marland Kitchen Physically Abused:    . Sexually Abused:         Social History     Tobacco Use   . Smoking status: Never Smoker   . Smokeless tobacco: Never Used   Substance Use Topics   . Alcohol use: No            My Assessment:  ENCOUNTER DIAGNOSES     ICD-10-CM   1. Non-proliferative diabetic retinopathy, mild, both eyes (CMS HCC)  M42.6834   2. 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 Blurred vision, bilateral  H53.8   4. Pseudophakia of both eyes  Z96.1         MD Addition to HPI: Pt here today for a NPV. Pt reports she previously followed with Dr Isabella Terrell and is s/p PPV. Pt states she also follows with retina specialist in [place].     Ophthalmic Plan of Care:    Mild NPDR with DME OU   - previously getting injections every 5-7 weeks in both eyes   - discussed continuing with anti-vegf   - recommend IVA OU today, pt will follow in [place] in 2 months     ERM with Lamellar Hole OS   - s/p membrane peel OS with Dr Isabella Terrell   - recommend monitoring    NSC OU   - pt wishes to follow in [place]          Follow up:    I have asked Isabella Terrell to follow up in prn       Documented chief complaint, history of present illness, allergies, review of systems, past medical, past surgical, family and social history were reviewed by me and when necessary I made changes to the technician note.  I also reviewed and agree with the findings documented in ophthalmology exam tab.  All questions were answered.      Orders Placed This Encounter   Procedures   . 50093 - INTRAVITREAL INJECTION OF A PHARMACOLOGIC AGENT (AMB ONLY)   . OPH OCT BI   . OPH FUNDUS PHOTOS       Orders Placed This Encounter   . 81829 - INTRAVITREAL INJECTION OF A PHARMACOLOGIC AGENT (AMB ONLY)   . bevacizumab (AVASTIN) 1.25 mg/0.05 mL INTRAVITREAL Injectable   . bevacizumab (AVASTIN) 1.25 mg/0.05 mL INTRAVITREAL Injectable       I am scribing for, and in the presence of, Dr. Hayden Terrell for services provided on Terrell.  Isabella Terrell, SCRIBE   [place], [place]  Terrell, 10:58        When a scribe documentaion is present , I attest that I personally performed the services described in this documentation, as scribed  in my presence, and it is both accurate  and complete.  When a resident or fellow documentation is present, I attest that I saw and examined the patient.  I reviewed the resident's note.  I agree with the findings and plan  of care as documented in the resident's note.  Any exceptions/additions are edited/noted.  When performed, I also attest that I reviewed the image(s)  and testing(s)  and agree with the interpretation and report as documented by the resident/fellow.  Exceptions as noted.    Lincoln Brigham, MD

## 2019-09-21 NOTE — Procedures (Signed)
[facility]  [address]  [place] [address]  Operated by [facility]                                         INTRAVITREAL INJECTION PROCEDURE NOTE       09/21/2019           Name:  Isabella Terrell  Chart#:  [id number]  DOB:  June 04, 1955      Right Eye Medication Administration  Initials: trn  Medications: AVASTIN (BEVACIZUMAB) 1.25 MG/ 0.05 ML OPHTH INJ.  Medication Dose Given: 1.25mg   Route of Administration: Intravitreal  LOT #: [id number]  Expiration date: 09/25/19  Clinic Supplied: Yes        Left Eye Medication Administration  Initials: trn  Medications: AVASTIN (BEVACIZUMAB) 1.25 MG/ 0.05 ML OPHTH INJ.  Medication Dose: 1.25mg   Route of Administration: Intravitreal  Site: Left Eye  LOT #: [id number]  Expiration Date: 09/25/19  Clinic Supplied: Yes    Informed Consent Obtained?  Yes  Prep: Betadine to ocular surface and adnexae  (Betadine drops into cul de sac followed by betadine swab along lid margins and to the lashes.)    Anesthesia : Topical Lidocaine Gel 3.5%    Surgical Pause: Yes Time:  23:23    The syringe with a 32-gage needle was inspected prior to injection.  0.05 cc of medication was injected and needle was slowly removed.   A sterile Q-tip was used to prevent egress of liquid vitreous or medication.  Excess medication was discarded.    Findings/Additional Notes/Comment:  Estimated blood loss: None   Conditions/complications:None  The patient maintained counting fingers vision or better after the procedure.  The patient tolerated the procedure well and left the exam room in good condition.    The postoperative instructions were given, including medications and activity level, as well as a follow up appointment and emergency contact number.   Lincoln Brigham, MD  09/21/2019, 23:23

## 2019-10-22 ENCOUNTER — Other Ambulatory Visit (HOSPITAL_COMMUNITY): Payer: Self-pay | Admitting: Specialist

## 2019-10-22 ENCOUNTER — Encounter (INDEPENDENT_AMBULATORY_CARE_PROVIDER_SITE_OTHER): Payer: Self-pay

## 2019-10-22 DIAGNOSIS — Z1231 Encounter for screening mammogram for malignant neoplasm of breast: Secondary | ICD-10-CM

## 2019-10-22 DIAGNOSIS — Z780 Asymptomatic menopausal state: Secondary | ICD-10-CM

## 2019-10-22 DIAGNOSIS — Z13820 Encounter for screening for osteoporosis: Secondary | ICD-10-CM

## 2019-12-09 ENCOUNTER — Ambulatory Visit (INDEPENDENT_AMBULATORY_CARE_PROVIDER_SITE_OTHER): Payer: No Typology Code available for payment source | Admitting: PODIATRY

## 2020-02-01 ENCOUNTER — Ambulatory Visit (HOSPITAL_BASED_OUTPATIENT_CLINIC_OR_DEPARTMENT_OTHER): Payer: Self-pay | Admitting: "Endocrinology

## 2020-02-01 NOTE — Telephone Encounter (Signed)
Regarding: pt would like a return call  ----- Message from Hezzie Bump sent at 02/01/2020  2:35 PM EST -----  Zadie Rhine, MD    Patient would like for you or one of your nurses to please return her call.  This is concerning her blood sugars, she also thinks her gabapetin needs increased.  Thank you!

## 2020-02-01 NOTE — Telephone Encounter (Signed)
Called, no answer. Left detailed message to provide blood sugars.Christie Nottingham, RN  02/01/2020, 14:43

## 2020-02-02 ENCOUNTER — Other Ambulatory Visit (HOSPITAL_BASED_OUTPATIENT_CLINIC_OR_DEPARTMENT_OTHER): Payer: Self-pay | Admitting: "Endocrinology

## 2020-02-02 DIAGNOSIS — E785 Hyperlipidemia, unspecified: Secondary | ICD-10-CM

## 2020-02-02 NOTE — Telephone Encounter (Signed)
Current Outpatient Medications   Medication Sig    aspirin (ECOTRIN) 81 mg Oral Tablet, Delayed Release (E.C.) Take 81 mg by mouth Once a day    atorvastatin (LIPITOR) 20 mg Oral Tablet Take 1 Tab (20 mg total) by mouth Once a day    Blood Sugar Diagnostic (ONETOUCH VERIO) Strip To check sugars 4 times daily as directed    cholecalciferol, Vitamin D3, (VITAMIN D-3) 5,000 unit Oral Tablet Take 5,000 Units by mouth Once a day    empagliflozin (JARDIANCE) 25 mg Oral Tablet Take 1 tablet daily by mouth.    flash glucose scanning reader (FREESTYLE LIBRE 14 DAY READER) Does not apply Misc To test blood sugars E10.9    flash glucose sensor (FREESTYLE LIBRE 14 DAY SENSOR) Does not apply Kit To test blood sugars E10.9    fluconazole (DIFLUCAN) 100 mg Oral Tablet Take 1 Tablet (100 mg total) by mouth Once a day    gabapentin (NEURONTIN) 400 mg Oral Capsule Take 1 Capsule (400 mg total) by mouth Every night    glucagon (GLUCAGEN) 1 mg/mL Injection Recon Soln 1 mg by Intramuscular route Once, as needed for Other (Blood sugar less than 70 and cannot eat) for up to 1 dose    insulin glargine (LANTUS SOLOSTAR U-100 INSULIN) 100 unit/mL Subcutaneous Insulin Pen Take up to 50 units nightly    insulin lispro (HUMALOG KWIKPEN INSULIN) 100 unit/mL Subcutaneous Insulin Pen Inject up to 40 units daily in 3 divided doses.    insulin pen needles (PEN NEEDLE) 32 gauge x 5/32" Needle To inject 4 times per day    lancets (ONE TOUCH DELICA) 33 gauge Misc To check sugars 4 times daily as directed    lisinopriL (PRINIVIL) 2.5 mg Oral Tablet Take 1 Tablet (2.5 mg total) by mouth Once a day    metFORMIN (GLUCOPHAGE XR) 500 mg Oral Tablet Sustained Release 24 hr Take 4 Tablets (2,000 mg total) by mouth Every night    naltrexone-bupropion (CONTRAVE) 8-90 mg Oral Tablet Sustained Release Take 1 Tablet by mouth Twice daily    omeprazole (PRILOSEC) 40 mg Oral Capsule, Delayed Release(E.C.) Take 40 mg by mouth Once a day     semaglutide (OZEMPIC) 1 mg/dose (2 mg/1.5 mL) Subcutaneous Pen Injector 1 mg by Subcutaneous route Every 7 days    vits A,C,E/zinc/copper (VISION-VITE PRESERVE ORAL) Take 1 Tab by mouth Twice daily     Last dept visit:  09/09/2019  Next pending dept visit: 03/23/2020  statin  Caroline Sauger, RN 02/02/2020, 07:18

## 2020-02-04 ENCOUNTER — Other Ambulatory Visit (HOSPITAL_BASED_OUTPATIENT_CLINIC_OR_DEPARTMENT_OTHER): Payer: Self-pay | Admitting: "Endocrinology

## 2020-02-04 DIAGNOSIS — E139 Other specified diabetes mellitus without complications: Secondary | ICD-10-CM

## 2020-02-04 MED ORDER — GABAPENTIN 300 MG CAPSULE
600.0000 mg | ORAL_CAPSULE | Freq: Every evening | ORAL | 1 refills | Status: DC
Start: 2020-02-04 — End: 2020-05-17

## 2020-02-04 NOTE — Telephone Encounter (Signed)
Will increase Neurontin to 600 mg nightly and send prescription. Will wait for patient to call back regarding her sugars and diabetes medication doses.     Gordy Levan, MD, MPH 02/04/2020, 16:08

## 2020-02-04 NOTE — Telephone Encounter (Signed)
-----   Message from Darnelle Spangle sent at 02/04/2020  2:30 PM EST -----  Education nurses    Pt call to give her sugar numbers.  Please call needs help with meds from earlier this week

## 2020-02-04 NOTE — Telephone Encounter (Signed)
Spoke with pt regarding below message, pt stated she could not provide sugars at this time because she was driving. She stated she can not upload her Josephine Igo and her MyChart is not working. She stated she already adjusted her own medications. I advised pt to write down sugar readings from [place] and call back into clinic with DM medication doses. Pt also stated her diabetic neuropathy is getting worse with pain in her legs and toes worsening at night for the past 6 weeks. She stated her pain is a 8-9/10. She stated she is currently on gabapentin 400 mg daily and wanted to see if provider would increase rx to 500 mg daily and send new rx to [facility] in [place], [place]. Routing to provider.Braxen Dobek, RN  02/04/2020, 14:55

## 2020-02-07 NOTE — Telephone Encounter (Signed)
Attempted patient contact regarding provider's below message, left detailed message to call clinic back.   Aayana Reinertsen, RN  02/07/2020, 07:22

## 2020-03-23 ENCOUNTER — Ambulatory Visit: Payer: No Typology Code available for payment source | Attending: "Endocrinology | Admitting: "Endocrinology

## 2020-03-23 ENCOUNTER — Ambulatory Visit (HOSPITAL_BASED_OUTPATIENT_CLINIC_OR_DEPARTMENT_OTHER): Payer: No Typology Code available for payment source

## 2020-03-23 ENCOUNTER — Encounter (HOSPITAL_BASED_OUTPATIENT_CLINIC_OR_DEPARTMENT_OTHER): Payer: Self-pay | Admitting: "Endocrinology

## 2020-03-23 ENCOUNTER — Telehealth (HOSPITAL_BASED_OUTPATIENT_CLINIC_OR_DEPARTMENT_OTHER): Payer: Self-pay | Admitting: "Endocrinology

## 2020-03-23 VITALS — BP 137/74 | HR 87 | Temp 96.8°F | Ht 61.0 in | Wt 162.3 lb

## 2020-03-23 DIAGNOSIS — E139 Other specified diabetes mellitus without complications: Secondary | ICD-10-CM | POA: Insufficient documentation

## 2020-03-23 DIAGNOSIS — E041 Nontoxic single thyroid nodule: Secondary | ICD-10-CM

## 2020-03-23 DIAGNOSIS — G9009 Other idiopathic peripheral autonomic neuropathy: Secondary | ICD-10-CM

## 2020-03-23 LAB — BASIC METABOLIC PANEL
ANION GAP: 7 mmol/L (ref 4–13)
BUN/CREA RATIO: 17 (ref 6–22)
BUN: 12 mg/dL (ref 8–25)
CALCIUM: 10 mg/dL (ref 8.8–10.2)
CHLORIDE: 104 mmol/L (ref 96–111)
CO2 TOTAL: 31 mmol/L (ref 23–31)
CREATININE: 0.71 mg/dL (ref 0.60–1.05)
ESTIMATED GFR: 90 mL/min/BSA (ref >=60)
GLUCOSE: 116 mg/dL (ref 65–125)
POTASSIUM: 4.4 mmol/L (ref 3.5–5.1)
SODIUM: 142 mmol/L (ref 136–145)

## 2020-03-23 MED ORDER — OZEMPIC 1 MG/DOSE (4 MG/3 ML) SUBCUTANEOUS PEN INJECTOR
2.0000 mg | PEN_INJECTOR | SUBCUTANEOUS | 3 refills | Status: DC
Start: 2020-03-23 — End: 2020-10-02

## 2020-03-23 NOTE — Patient Instructions (Signed)
Decrease Lantus to 14 units nightly.  Decrease Humalog to 12 units with biggest meal  Continue metformin 1000 mg twice a day and Jardiance 25 mg daily  Increase Ozempic to 2 mg once a week. When Wegovy comes back in stock, will switch to that

## 2020-03-23 NOTE — Progress Notes (Signed)
[facility]  RETURN VISIT    Chief Complaint:  LADA  Referring Provider: No Pcp  PCP: No Pcp     HPI:   Isabella Terrell is a 64 y.o. female with PMH of HTN, HLD, peripheral neuropathy, and diabetic retinopathy who presents for follow up of LADA/type I diabetes mellitus.     Past history:  -Diagnosed with type II diabetes mellitus about 25 years ago after presenting to her PCP with polyuria  -Has never been hospitalized for high or low sugars. Has never been in DKA  -Was having multiple UTI and yeast infections so her gynecologist checked her HgA1C on 11/21/16 which was elevated to 12.6%. No longer having frequent UTIs  -When established here, GAD and islet cell antibodies were checked to evaluate for type I DM with GAD antibodies coming back positive likely indicating LADA however patient also has insulin resistance in addition to detectable C-peptide 2.0 in 08/2019 so currently treating as both type I and type II (mostly type II)  -She has a [employer]    I uploaded, reviewed, interpreted, and made recommendations based on patient's CGM data.  -In target 58%, high 32%, very high 9%, low 1%, very low 0%    -Most recent HgA1C:  Lab Results   Component Value Date    HA1C 8.0 (H) 09/09/2019     CURRENT ANTI-DIABETIC REGIMEN:  Patient is adherent to meds   Lantus 16 units qPM   Humalog mostly with biggest meal (dinner) around 24 units this past month- past 2 months was only needing about 12 units or less. Having a lot of back pain requiring ibuprofen. Has PCP appointment on 04/04/2020   Ozempic 1 mg weekly   Jardiance 25 mg daily   Metformin-XR 1000 mg BID     PREVIOUS ANTI-DIABETIC MEDS:   Januvia- D/Ced to switch to insulin   Metformin (IR)- D/Ced to switch to insulin     BLOOD GLUCOSE TRENDS: Testing BG 10-15 times per day via Freestyle Libre  Time Readings   Before Breakfast    Before Lunch    Before Supper    Bedtime         HYPOGLYCEMIA EVALUATION:  Is it occurring? Symptoms? Was in  53's twice in middle of the night usually; symptoms include feeling weak   Hypoglycemic Event: (Date/Time) About twice a month but none this past month   Treatment: Food, juice   Assisted or Unassisted: Unassisted   Hypoglycemic unawareness? No; feels weird when sugars in 70s   -Glucagon kit: Yes    Insurance stopped covering Contrave. Will be going back to [place] next month.     DIABETIC COMPLICATIONS:  Microvascular Complications:    Peripheral/Autonomic Neuropathy: +Mild peripheral neuropathy, On Neurontin 600 qPM. Was not able to see Podiatry since was in [place]   Retinopathy: Last eye exam 07/2019. Has diabetic retinopathy requiring eye injections. Her current eye doctor here had open heart surgery; has an eye doctor in [place] but needs a new one here   Renal: Last GFR:   Lab Results   Component Value Date    GFR 80 09/09/2019      Microalb/crt ratio:  Urine Microalb/cr ratio   Lab Results   Component Value Date/Time    MICALBCRERAT 7.9 03/04/2019 01:08 PM         On ACEI/ARB: Lisinopril 2.5 mg daily    Macrovascular Complications:   Hx of CAD: No   Hx of statin/ASA: On Lipitor, no  aspirin; SGLT2 or GLP-1 therapy? No SGLT-2; currently on Ozempic and Jardiance   Hx of Stroke: No   Hx of HTN: Yes   Hx of HLD: Yes    Has lost about 15 lbs since last appointment. Having arthritis in back.   Wt Readings from Last 15 Encounters:   03/23/20 73.6 kg (162 lb 4.1 oz)   09/09/19 78 kg (171 lb 15.3 oz)   03/04/19 77.6 kg (171 lb 1.2 oz)   09/08/18 79.7 kg (175 lb 11.3 oz)   03/03/18 80.1 kg (176 lb 9.4 oz)   11/11/17 76.6 kg (168 lb 14 oz)   07/25/17 74.6 kg (164 lb 7.4 oz)   07/15/17 74.9 kg (165 lb 2 oz)   03/11/17 74.7 kg (164 lb 10.9 oz)   01/16/17 69.9 kg (154 lb)   01/16/17 70.3 kg (154 lb 15.7 oz)   12/17/16 71.1 kg (156 lb 12 oz)   10/12/12 67.5 kg (148 lb 13 oz)   09/23/12 68.9 kg (152 lb)     REVIEW OF SYSTEMS:  ROS: All systems reviewed and negative except for as above.    Problem  List:  LADA  Hypertension  Hyperlipidemia  Difficulty Losing Weight    Past Medical History:   Diagnosis Date    Diabetic retinopathy (CMS HCC)     HLD (hyperlipidemia)     HTN (hypertension)     LADA (latent autoimmune diabetes in adults), managed as type 1 (CMS HCC)      Past Surgical History:   Procedure Laterality Date    ENDOMETRIAL ABLATION      HX TONSILLECTOMY      HX WISDOM TEETH EXTRACTION      TRIGGER FINGER RELEASE       Social History     Tobacco Use    Smoking status: Never Smoker    Smokeless tobacco: Never Used   Substance Use Topics    Alcohol use: No    Drug use: No        Family Medical History:     Problem Relation (Age of Onset)    Coronary Artery Disease Mother    Diabetes Father    Diabetes type II Father, Brother, Paternal Uncle, Sister    Hypothyroidism Mother, Sister    Multiple Sclerosis Brother    Other Mother, Father          Current Outpatient Medications   Medication Sig    aspirin (ECOTRIN) 81 mg Oral Tablet, Delayed Release (E.C.) Take 81 mg by mouth Once a day    atorvastatin (LIPITOR) 20 mg Oral Tablet TAKE 1 TABLET BY MOUTH EVERY DAY    Blood Sugar Diagnostic (ONETOUCH VERIO) Strip To check sugars 4 times daily as directed    cholecalciferol, Vitamin D3, (VITAMIN D-3) 5,000 unit Oral Tablet Take 5,000 Units by mouth Once a day    empagliflozin (JARDIANCE) 25 mg Oral Tablet Take 1 tablet daily by mouth.    flash glucose scanning reader (FREESTYLE LIBRE 14 DAY READER) Does not apply Misc To test blood sugars E10.9    flash glucose sensor (FREESTYLE LIBRE 14 DAY SENSOR) Does not apply Kit To test blood sugars E10.9    gabapentin (NEURONTIN) 300 mg Oral Capsule Take 2 Capsules (600 mg total) by mouth Every night    glucagon (GLUCAGEN) 1 mg/mL Injection Recon Soln 1 mg by Intramuscular route Once, as needed for Other (Blood sugar less than 70 and cannot eat) for up to 1 dose  insulin glargine (LANTUS SOLOSTAR U-100 INSULIN) 100 unit/mL Subcutaneous Insulin Pen  Take up to 50 units nightly    insulin lispro (HUMALOG KWIKPEN INSULIN) 100 unit/mL Subcutaneous Insulin Pen Inject up to 40 units daily in 3 divided doses.    insulin pen needles (PEN NEEDLE) 32 gauge x 5/32" Needle To inject 4 times per day    lancets (ONE TOUCH DELICA) 33 gauge Misc To check sugars 4 times daily as directed    lisinopriL (PRINIVIL) 2.5 mg Oral Tablet Take 1 Tablet (2.5 mg total) by mouth Once a day    metFORMIN (GLUCOPHAGE XR) 500 mg Oral Tablet Sustained Release 24 hr Take 4 Tablets (2,000 mg total) by mouth Every night    omeprazole (PRILOSEC) 40 mg Oral Capsule, Delayed Release(E.C.) Take 40 mg by mouth Once a day    semaglutide (OZEMPIC) 1 mg/dose (4 mg/3 mL) Subcutaneous Pen Injector 2 mg by Subcutaneous route Every 7 days    vits A,C,E/zinc/copper (VISION-VITE PRESERVE ORAL) Take 1 Tab by mouth Twice daily     No Known Allergies    PHYSICAL EXAM:   Vitals:    03/23/20 0810   BP: 137/74   Pulse: 87   Temp: 36 C (96.8 F)   Weight: 73.6 kg (162 lb 4.1 oz)   Height: 1.549 m (_0 )   BMI: 30.72      General: Well appearing female in no acute distress.  Psychiatric: Normal mood and affect  Neuro: Alert and oriented x 3. Speech fluent. Cranial nerves II-XII intact. No focal deficits noted.   HEENT: Head normocephalic, atraumatic. PERRLA, EOMI, conjunctiva clear. Wearing a face mask.   Thyroid/Neck: Trachea midline  Lungs: Normal respiratory effort.   Cardiac: Regular rate and rhythm  Abdomen: Non-distended.   Extremities: No edema or erythema.  Skin: No visible rashes.       LABS:  Lab Results   Component Value Date/Time    HA1C 8.0 (H) 09/09/2019 09:05 AM    SODIUM 143 09/09/2019 09:05 AM    POTASSIUM 4.4 09/09/2019 09:05 AM    CHLORIDE 104 09/09/2019 09:05 AM    CO2 28 09/09/2019 09:05 AM    BUN 9 09/09/2019 09:05 AM    CREATININE 0.79 09/09/2019 09:05 AM    MICALBCRERAT 7.9 03/04/2019 01:08 PM    AST 31 08/28/2018 09:10 AM    ALT 41 08/28/2018 09:10 AM     12/24/2016 1:28 PM - Edi,  Reference Lab Results In      Component Results     Component Value Ref Range & Units Status    GAD65 AB ASSAY 0.12 (H) <=0.02 nmol/L Final     12/24/2016 1:30 PM - Edi, Reference Lab Results In      Component Results     Component Value Ref Range & Units Status    ISLET ANTIGEN 2 (IA-2) ANTIBODY, SERUM 0.00 <=0.02 nmol/L Final        Ref. Range 09/09/2019 09:05   TSH Latest Ref Range: 0.430 - 3.550 uIU/mL 1.853   C-PEPTIDE Latest Ref Range: 0.9 - 7.1 ng/mL 2.0     ASSESSMENT:  Isabella Terrell is a 64 y.o.   female with PMH of HTN, HLD, peripheral neuropathy, and diabetic retinopathy who presents for follow up of LADA/type I diabetes mellitus.      PLAN:  LADA with Insulin Resistance complicated by Frequent Evening Hyperglycemia and Occasional Morning Hypoglycemia  -Complicated by frequent evening hyperglycemia, occasional morning hypoglycemia, mild peripheral neuropathy, past microalbuminuria (  now normal as of 2018), diabetic retinopathy and macrovascular diseases including HTN, HLD  -Anti-GAD and anti-islet cell antibodies to evaluate for type 1 DM checked and positive for GAD antibody however patient also has insulin resistance and requires higher doses of insulin which has contributed to weight gain  -C-peptide detectable at 2.0 in 08/2019   -Most recent HgA1C 8% in 08/2019; will repeat today. Urine microalbumin/creatinine ratio 0.6 in 02/2019, will repeat. Currently on lisinopril 2.5 mg daily  -Annual diabetes foot exam done 09/09/2019; provided external referral to Podiatry so she can see them in [place] (will be leaving next month) as patient requests assistance with cutting toenails in setting of peripheral neuropathy  -Most recent diabetic eye exam 07/2019. Has diabetic retinopathy requiring eye injections  -Hypoglycemia occasionally occuring. Has glucagon injection  -Reviewed blood sugars via Libre CGM download. AM and before bedtime sugars well-controlled but having hyperglycemia later in the day. Has  some lows in early morning  -Will reduce Lantus to 14 units qPM  -Will reduce Humalog to 12 units with biggest meal of the day and increase Ozempic to 2 mg weekly. When GBTDVV comes back in stock, will then increase to 2.4 mg weekly dose  -Continue Lantus 20 units qPM. Will increase Humalog to 28 units BID-AC with two biggest meals of the day  -Continue metformin-XR 1000 mg BID and Jardiance 25 mg daily  -Has glucometer, test strips, lancets, pen needles and Libre sensors  -Most recent BMP 08/2019 with creatinine 0.79 and GFR 80, will repeat today  -Continue to check sugars 4x/day via FreeStyle Libre and to bring sugar logs and glucometer to clinic appointments    Peripheral Neuropathy  -Continue Neurontin 600 mg qPM  -Referred to Podiatry for assistance with cutting toenails in setting of neuropathy     Hgba1c Goal <7%   Labs today: HgA1C, BMP, urine microalbumin/Cr ratio   Yearly eye and daily foot exams discussed   Symptoms of potential hypoglycemia discussed and treatment of hypoglycemia discussed   Return visit in 6 months or earlier if needed      Right Thyroid Nodule  -TSH normal in 08/2019. Thyroid ultrasound done 03/06/17 without thyroid nodules with homogenous thyroid texture  -Repeat ultrasound 08/2019 showed only a small 0.4 cm right isoechoic nodule, will repeat ultrasound in 1 year (around 08/2020)    Orders Placed This Encounter    HGA1C (HEMOGLOBIN A1C WITH EST AVG GLUCOSE)    MICROALBUMIN/CREATININE RATIO, URINE, RANDOM    BASIC METABOLIC PANEL    Refer to Podiatry-External    semaglutide (OZEMPIC) 1 mg/dose (4 mg/3 mL) Subcutaneous Pen Injector     Keith Rake, MD, MPH 03/23/2020, 08:38

## 2020-03-23 NOTE — Telephone Encounter (Signed)
Call center called triage line, transferred patient. Patient stated she was at clinic this morning for visit, had blood drawn. Stated when she removed the bandage from her arm, she put her shirt back on and noticed aching pain under breast on same side as blood draw. Patient stated no site irritation where blood was taken or bandage was placed. Patient only complained of under-breast pain. Advised patient to go to urgent care or PCP for physical assessment as this is unlikely related to blood draw. Patient requesting to be scheduled with [facility] today, provided scheduling line of [phone] for patient to call. Patient requested mammogram order to have done today. Informed patient that whoever evaluates her for breast pain will need to advise/order additional testing at this time. Patient hung up the phone.Christie Nottingham, RN  03/23/2020, 09:48

## 2020-03-24 LAB — HGA1C (HEMOGLOBIN A1C WITH EST AVG GLUCOSE)
ESTIMATED AVERAGE GLUCOSE: 163 mg/dL
HEMOGLOBIN A1C: 7.3 % — ABNORMAL HIGH (ref 4.0–5.6)

## 2020-03-29 ENCOUNTER — Other Ambulatory Visit (HOSPITAL_COMMUNITY): Payer: Self-pay

## 2020-03-29 ENCOUNTER — Ambulatory Visit
Admission: RE | Admit: 2020-03-29 | Discharge: 2020-03-29 | Disposition: A | Discharge status: Home / Self Care | Payer: No Typology Code available for payment source | Source: Ambulatory Visit | Attending: Family | Admitting: Family

## 2020-03-29 DIAGNOSIS — M47817 Spondylosis without myelopathy or radiculopathy, lumbosacral region: Secondary | ICD-10-CM

## 2020-03-29 DIAGNOSIS — R609 Edema, unspecified: Secondary | ICD-10-CM

## 2020-03-29 DIAGNOSIS — M533 Sacrococcygeal disorders, not elsewhere classified: Secondary | ICD-10-CM

## 2020-03-29 DIAGNOSIS — M5451 Vertebrogenic low back pain: Secondary | ICD-10-CM | POA: Insufficient documentation

## 2020-03-29 DIAGNOSIS — M160 Bilateral primary osteoarthritis of hip: Secondary | ICD-10-CM

## 2020-03-29 DIAGNOSIS — M8588 Other specified disorders of bone density and structure, other site: Secondary | ICD-10-CM

## 2020-04-25 ENCOUNTER — Other Ambulatory Visit (HOSPITAL_BASED_OUTPATIENT_CLINIC_OR_DEPARTMENT_OTHER): Payer: Self-pay | Admitting: "Endocrinology

## 2020-04-25 DIAGNOSIS — E139 Other specified diabetes mellitus without complications: Secondary | ICD-10-CM

## 2020-04-26 MED ORDER — LISINOPRIL 2.5 MG TABLET
2.5000 mg | ORAL_TABLET | Freq: Every day | ORAL | 3 refills | Status: DC
Start: 2020-04-26 — End: 2020-11-15

## 2020-04-26 NOTE — Telephone Encounter (Addendum)
Pending lisinopril to new pharmacy. Not taking this dose of gabapentin. Correct dose has refill on file.Christie Nottingham, RN  04/26/2020, 09:03

## 2020-04-28 IMAGING — MG MAMMOGRAPHY DIAGNOSTIC BILATERAL 3D TOMOSYNTHESIS WITH CAD
6 series · 6 of 18 positions shown · non-contrast
Comparison: 10/29/2018.

MAMMOGRAPHY DIAGNOSTIC BILATERAL 3D TOMOSYNTHESIS WITH CAD, 04/28/2020 [DATE]: 
CLINICAL INDICATION: Breast pain that subsequently resolved.
TECHNIQUE: Digital bilateral mammograms and 3-D Tomosynthesis were obtained. 
These were interpreted both primarily and with the aid of computer-aided 
detection system. 
BREAST DENSITY: (Level B) There are scattered areas of fibroglandular density.

[R MLO]
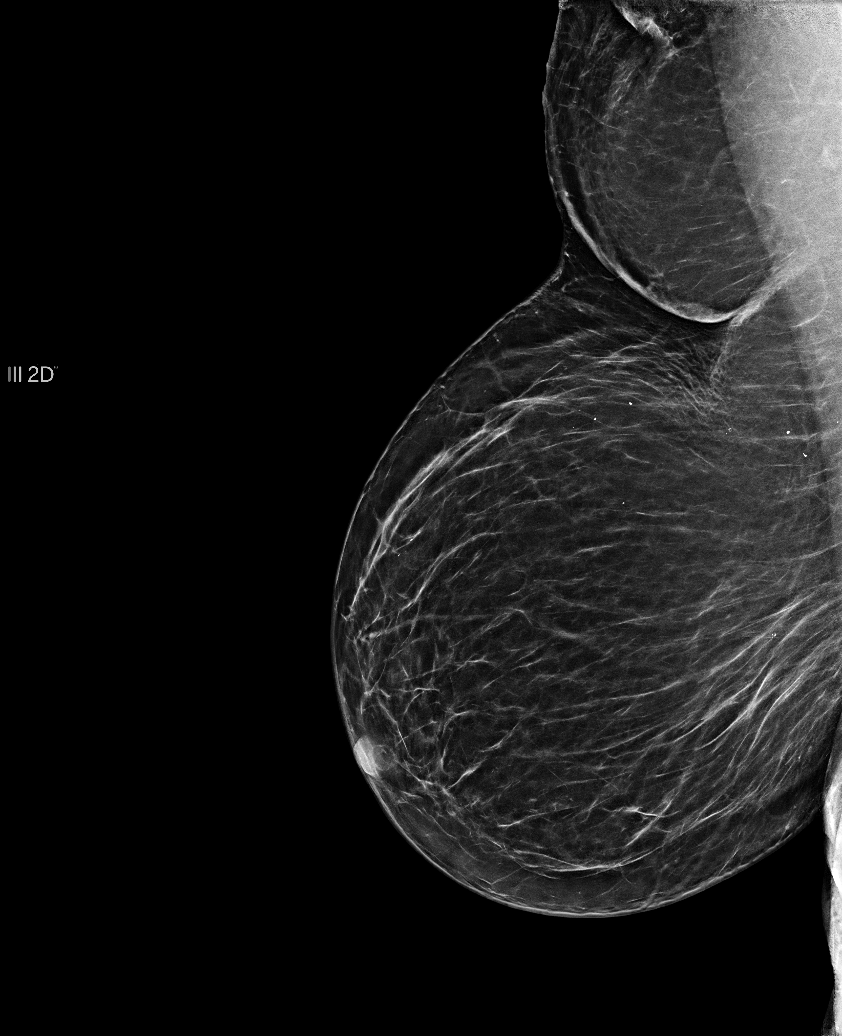

[L MLO]
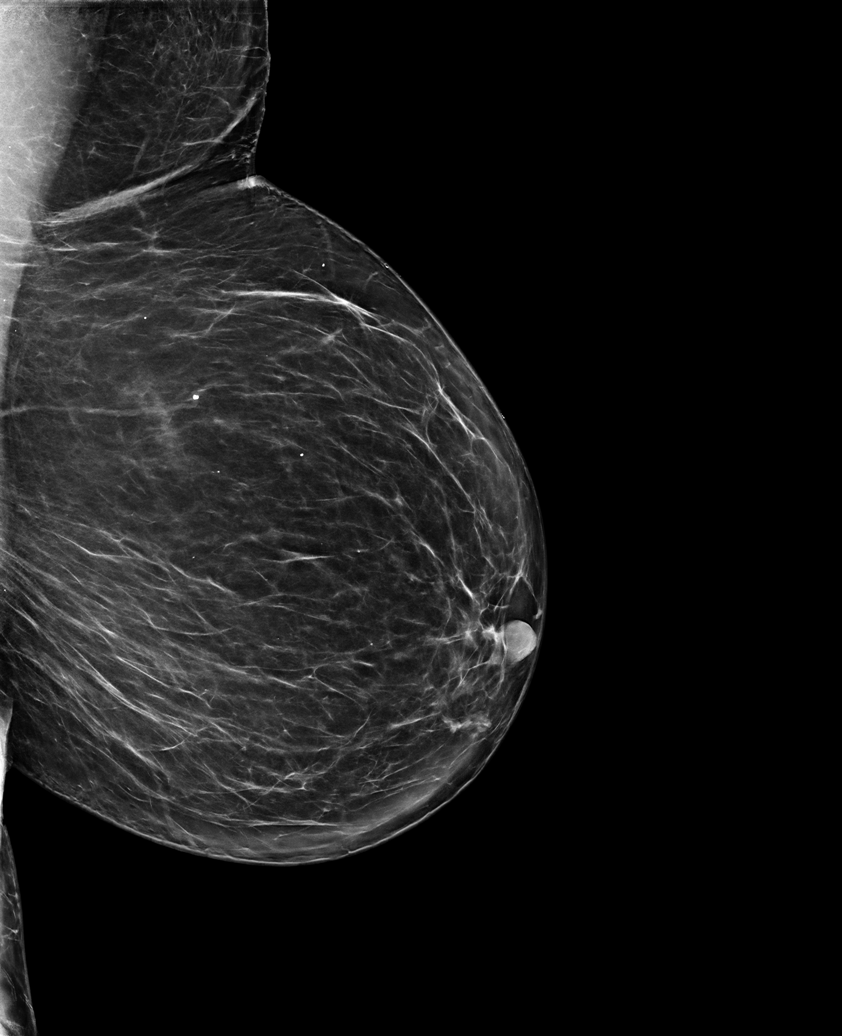

[R CC]
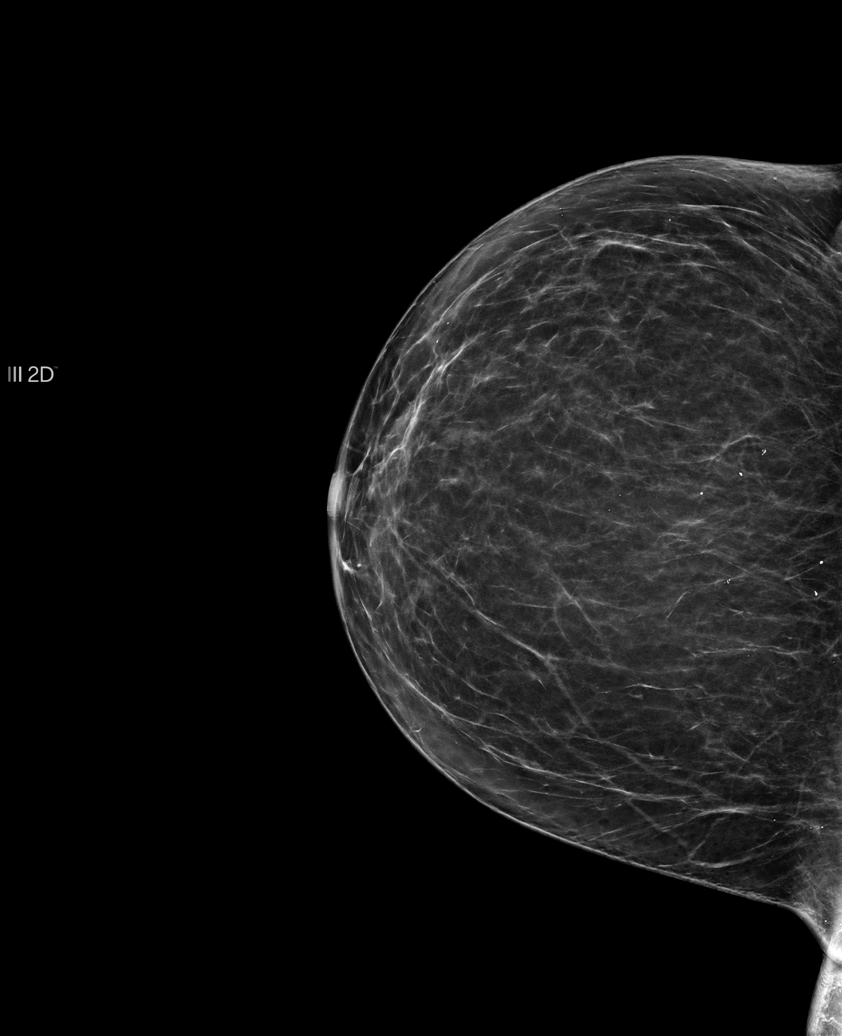

[R CC tomo · tomo slice 30/59.0]
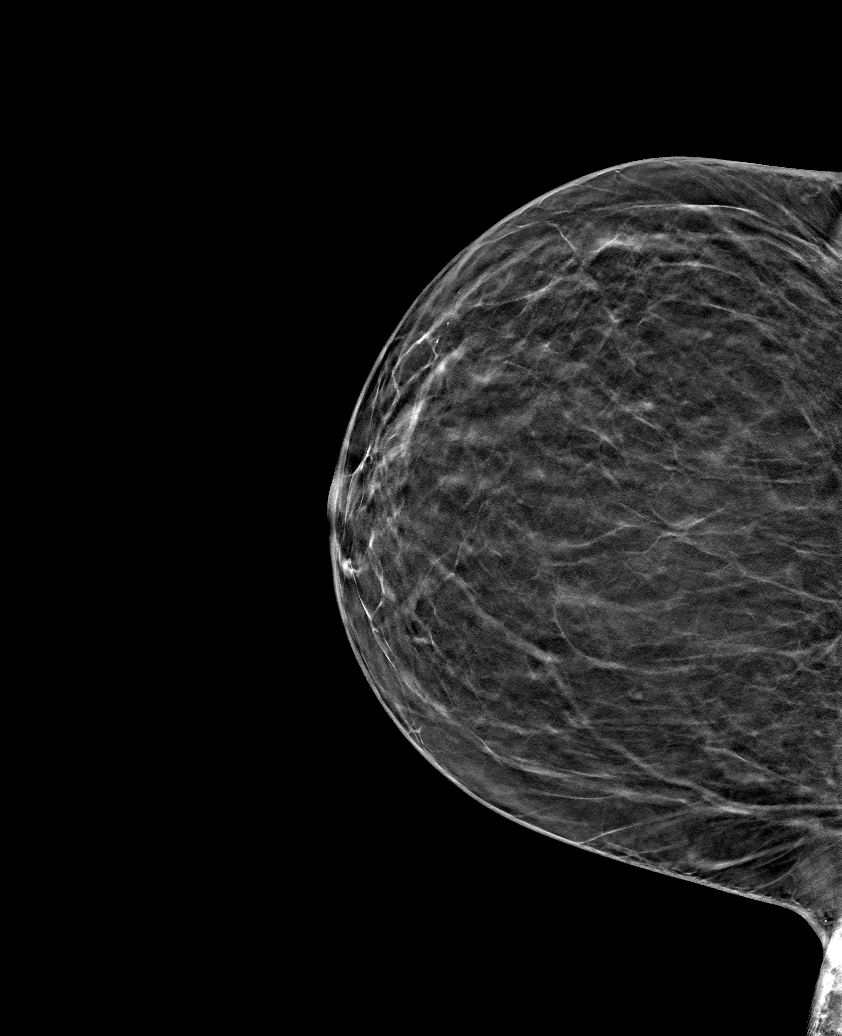

[R MLO tomo · tomo slice 37/74.0]
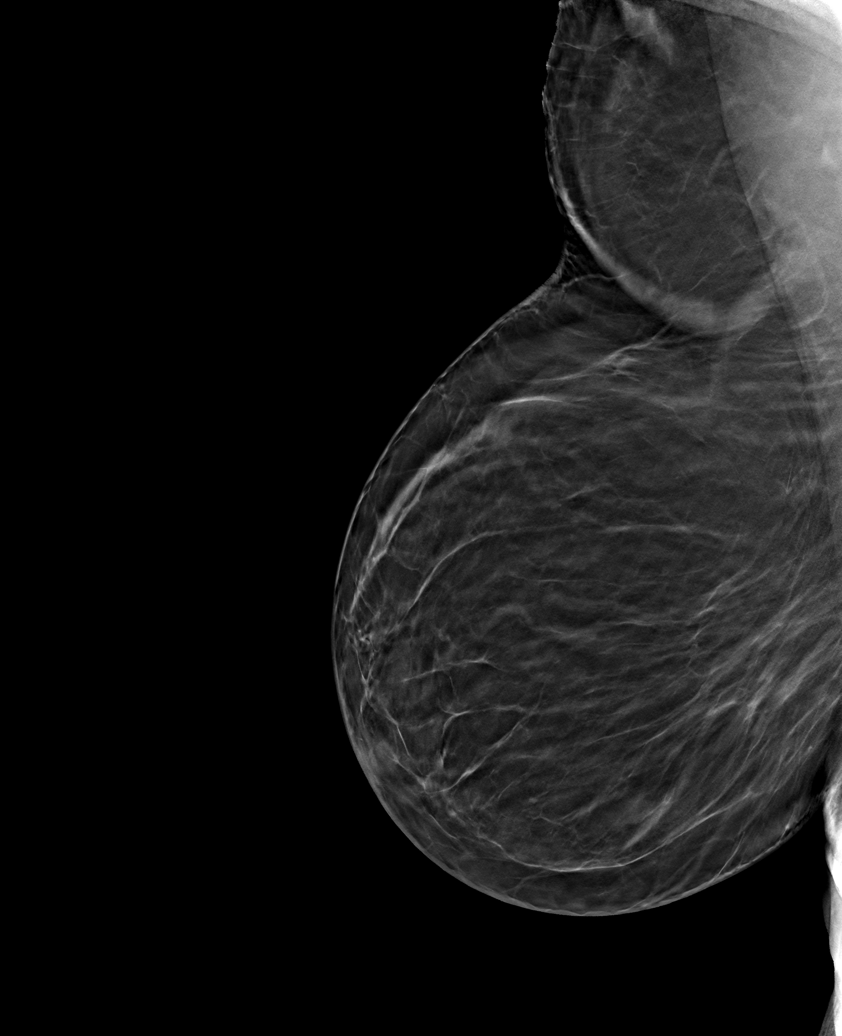

[L MLO tomo · tomo slice 37/72.0]
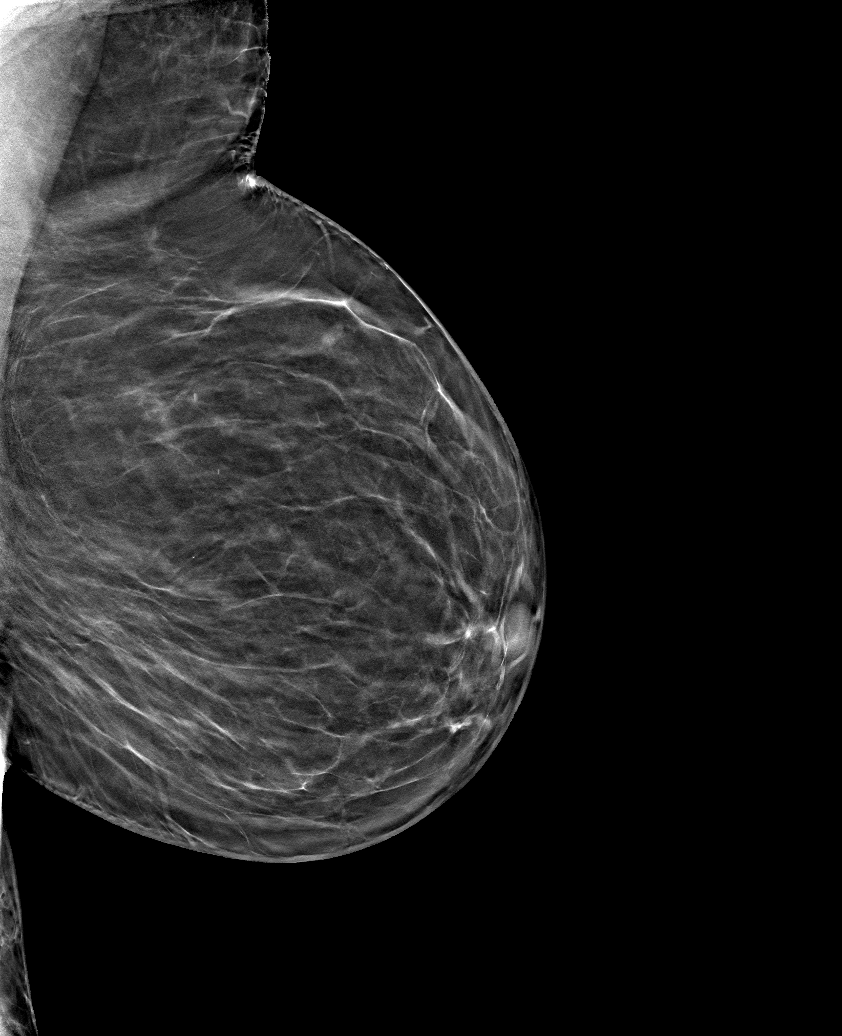

[6 of 18 positions shown; findings below may reference images not displayed]

FINDINGS: Benign calcification. No suspicious mass, calcifications, or 
architectural distortion in either breast.
IMPRESSION: Stable mammogram. 
( BI-RADS 2) Benign findings. Routine mammographic follow-up is recommended.

## 2020-05-17 ENCOUNTER — Other Ambulatory Visit (HOSPITAL_BASED_OUTPATIENT_CLINIC_OR_DEPARTMENT_OTHER): Payer: Self-pay | Admitting: "Endocrinology

## 2020-05-17 ENCOUNTER — Ambulatory Visit (HOSPITAL_BASED_OUTPATIENT_CLINIC_OR_DEPARTMENT_OTHER): Payer: Self-pay | Admitting: "Endocrinology

## 2020-05-17 DIAGNOSIS — E139 Other specified diabetes mellitus without complications: Secondary | ICD-10-CM

## 2020-05-17 DIAGNOSIS — E669 Obesity, unspecified: Secondary | ICD-10-CM

## 2020-05-17 MED ORDER — WEGOVY 2.4 MG/0.75 ML SUBCUTANEOUS PEN INJECTOR
2.4000 mg | PEN_INJECTOR | SUBCUTANEOUS | 3 refills | Status: DC
Start: 2020-05-17 — End: 2020-10-02

## 2020-05-17 NOTE — Telephone Encounter (Signed)
Regarding: pt out of rx  ----- Message from Oneida Arenas sent at 05/17/2020  2:18 PM EST -----  Doctor Name:  Zadie Rhine, MD       Date of last appointment: 03/23/20     Next scheduled visit: 09/22/20    Medication Requested:     semaglutide (OZEMPIC) 1 mg/dose (4 mg/3 mL) Subcutaneous Pen Injector      Preferred Pharmacy     [id number] [address]    [address] [place] [address]    Phone: [phone] Fax: [phone]    Hours: Not open 24 hours          Notes for Nurse or Physician: Requesting a 90 day supply. Pt also stated that their pharmacist mentioned something about Wegovy needing released. Please call pt to advise.

## 2020-05-17 NOTE — Telephone Encounter (Signed)
It may not be back in stock yet but I will try sending Wegovy 2.4 mg weekly; she is currently on Ozempic 2 mg weekly.     Gordy Levan, MD, MPH 05/17/2020, 15:28

## 2020-05-17 NOTE — Telephone Encounter (Signed)
Current Outpatient Medications   Medication Sig   . aspirin (ECOTRIN) 81 mg Oral Tablet, Delayed Release (E.C.) Take 81 mg by mouth Once a day   . atorvastatin (LIPITOR) 20 mg Oral Tablet TAKE 1 TABLET BY MOUTH EVERY DAY   . Blood Sugar Diagnostic (ONETOUCH VERIO) Strip To check sugars 4 times daily as directed   . cholecalciferol, Vitamin D3, (VITAMIN D-3) 5,000 unit Oral Tablet Take 5,000 Units by mouth Once a day   . empagliflozin (JARDIANCE) 25 mg Oral Tablet Take 1 tablet daily by mouth.   . flash glucose scanning reader (FREESTYLE LIBRE 14 DAY READER) Does not apply Misc To test blood sugars E10.9   . flash glucose sensor (FREESTYLE LIBRE 14 DAY SENSOR) Does not apply Kit To test blood sugars E10.9   . gabapentin (NEURONTIN) 300 mg Oral Capsule Take 2 Capsules (600 mg total) by mouth Every night   . glucagon (GLUCAGEN) 1 mg/mL Injection Recon Soln 1 mg by Intramuscular route Once, as needed for Other (Blood sugar less than 70 and cannot eat) for up to 1 dose   . insulin glargine (LANTUS SOLOSTAR U-100 INSULIN) 100 unit/mL Subcutaneous Insulin Pen Take up to 50 units nightly   . insulin lispro (HUMALOG KWIKPEN INSULIN) 100 unit/mL Subcutaneous Insulin Pen Inject up to 40 units daily in 3 divided doses.   Marland Kitchen insulin pen needles (PEN NEEDLE) 32 gauge x 5/32" Needle To inject 4 times per day   . lancets (ONE TOUCH DELICA) 33 gauge Misc To check sugars 4 times daily as directed   . lisinopriL (PRINIVIL) 2.5 mg Oral Tablet Take 1 Tablet (2.5 mg total) by mouth Once a day   . metFORMIN (GLUCOPHAGE XR) 500 mg Oral Tablet Sustained Release 24 hr Take 4 Tablets (2,000 mg total) by mouth Every night   . omeprazole (PRILOSEC) 40 mg Oral Capsule, Delayed Release(E.C.) Take 40 mg by mouth Once a day   . semaglutide (OZEMPIC) 1 mg/dose (4 mg/3 mL) Subcutaneous Pen Injector 2 mg by Subcutaneous route Every 7 days   . vits A,C,E/zinc/copper (VISION-VITE PRESERVE ORAL) Take 1 Tab by mouth Twice daily     Last dept visit:   03/23/2020  Next pending dept visit: 09/22/2020  Gabapentin 300 mg capsule BID  Caroline Sauger, RN 05/17/2020, 15:05

## 2020-05-17 NOTE — Telephone Encounter (Signed)
Sent in another encounter.Christie Nottingham, RN  05/17/2020, 15:13

## 2020-05-17 NOTE — Telephone Encounter (Signed)
Per office note, patient to start wegovy once back in stock. Routing to provider to send rx.Christie Nottingham, RN  05/17/2020, 15:12

## 2020-06-12 ENCOUNTER — Ambulatory Visit (HOSPITAL_BASED_OUTPATIENT_CLINIC_OR_DEPARTMENT_OTHER): Payer: Self-pay | Admitting: "Endocrinology

## 2020-06-12 NOTE — Telephone Encounter (Signed)
Regarding: PA needed for medication  ----- Message from Dimple Casey sent at 06/12/2020 10:52 AM EDT -----  Zadie Rhine, MD    Pt. Called in about the semaglutide, weight loss, (WEGOVY) 2.4 mg/0.75 mL Subcutaneous Pen Injector, she is being told that it needs a PA in order to receive it she has a number to call that in to,    [phone] for PA      [id number] [address] Phone: [phone]  Fax: [phone]

## 2020-06-12 NOTE — Nursing Note (Signed)
Received approval from [facility] for PT's [facility] 2.4mg . Approved from 06/12/20-01/12/21. Faxed information to pharmacy and sent PT a MyChart Message.    Isabella Terrell  06/12/2020, 17:02

## 2020-06-12 NOTE — Nursing Note (Signed)
Prior authorization completed for [name] 2.4mg  via cover my meds, awaiting approval.     Isabella Terrell  06/12/2020, 12:19

## 2020-06-14 ENCOUNTER — Other Ambulatory Visit (HOSPITAL_BASED_OUTPATIENT_CLINIC_OR_DEPARTMENT_OTHER): Payer: Self-pay | Admitting: "Endocrinology

## 2020-06-14 NOTE — Telephone Encounter (Signed)
Patient should still have medication. 3 month supply was given with 1 refill. Medication was pended last month  Fidela Salisbury, [place]  06/14/2020, 13:07

## 2020-06-16 ENCOUNTER — Other Ambulatory Visit (HOSPITAL_BASED_OUTPATIENT_CLINIC_OR_DEPARTMENT_OTHER): Payer: Self-pay | Admitting: "Endocrinology

## 2020-06-16 DIAGNOSIS — E139 Other specified diabetes mellitus without complications: Secondary | ICD-10-CM

## 2020-06-16 NOTE — Telephone Encounter (Signed)
Regarding: reader missing / refill   ----- Message from Sander Nephew sent at 06/16/2020 10:52 AM EDT -----  Isabella Rhine, MD    The patient states she had company and somehow her Josephine Igo Reader came up missing, and her pharmacy told her they need a new Rx. She is also needing the [employer] filled as well   (((she has been unable to check her glucose levels for almost a week)))    Please call pt to advise.    [facility] Rx's please     Preferred Pharmacy     [id number] [place] - [address]    [address] [place] [address]    Phone: [phone] Fax: [phone]    Hours: Not open 24 hours

## 2020-06-19 MED ORDER — FREESTYLE LIBRE 14 DAY SENSOR KIT
PACK | 3 refills | Status: DC
Start: 2020-06-19 — End: 2021-01-26

## 2020-06-19 MED ORDER — FREESTYLE LIBRE 14 DAY READER
0 refills | Status: DC
Start: 2020-06-19 — End: 2021-10-05

## 2020-06-19 NOTE — Telephone Encounter (Signed)
Refills on file. Pt notified.Christie Nottingham, RN  06/19/2020, 10:15

## 2020-06-26 ENCOUNTER — Ambulatory Visit (HOSPITAL_BASED_OUTPATIENT_CLINIC_OR_DEPARTMENT_OTHER): Payer: Self-pay | Admitting: "Endocrinology

## 2020-06-26 NOTE — Telephone Encounter (Signed)
Regarding: Wegovy  ----- Message from Sander Nephew sent at 06/26/2020 10:33 AM EDT -----  Zadie Rhine, MD    The pt states she took [facility] on Wednesday and took 0.25, as directed by the pharmacy, but your directions told her to use the whole pen.  She thinks this is the starter dose and it might be too low and she's due to take this again on Wednesday and she wants to know what the dose is that she's to be taking.  Please call and advise patient.

## 2020-08-05 IMAGING — MR MRI LUMBAR SPINE WITHOUT CONTRAST
4 of 6 series · 22 of 48 positions shown · IV contrast (gadolinium)
Comparison: None

MRI LUMBAR SPINE WITHOUT CONTRAST, 08/05/2020 [DATE]: 
CLINICAL INDICATION: Spondylosis with radiculopathy, patient describes low back 
pain extending to groin
TECHNIQUE: Sagittal T1, Sagittal T2, Sagittal STIR, Axial T1 and Axial T2 MR 
images of the lumbar spine were performed without intravenous gadolinium 
enhancement.

[Series 101: survey · axial · 10.0mm · 1.39mm/px · z∈[-33,+201]mm · 5 of 10 slices shown]
[im 1/10]
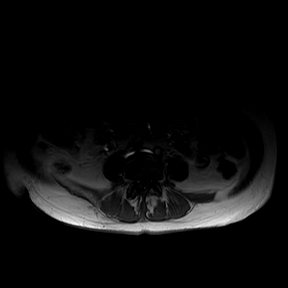
[im 3/10]
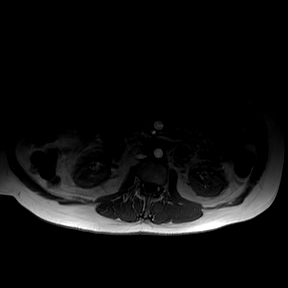
[im 5/10]
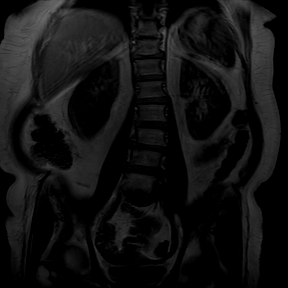
[im 7/10]
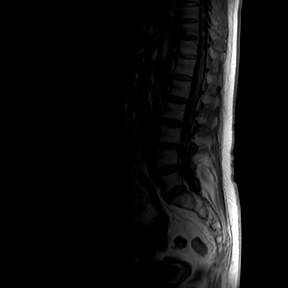
[im 10/10]
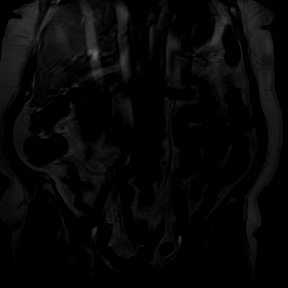

[Series 201: t2w_cor-surv · coronal · 6.0mm · 0.60mm/px · 3 of 7 slices shown]
[im 1/7]
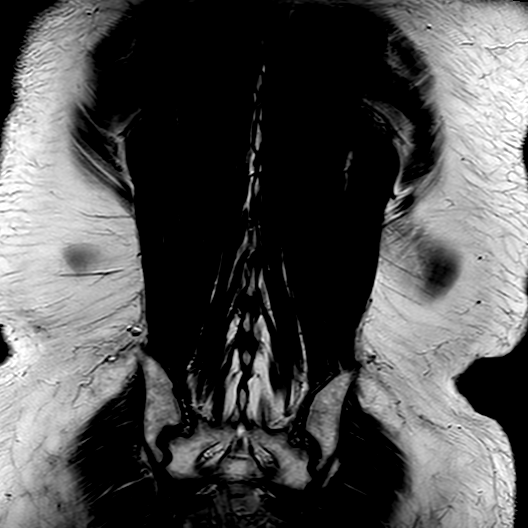
[im 4/7]
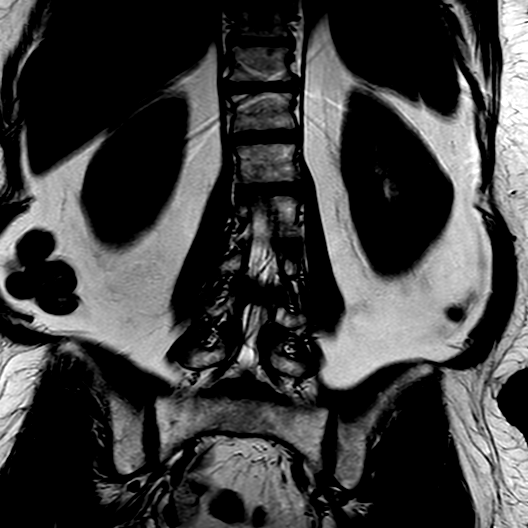
[im 7/7]
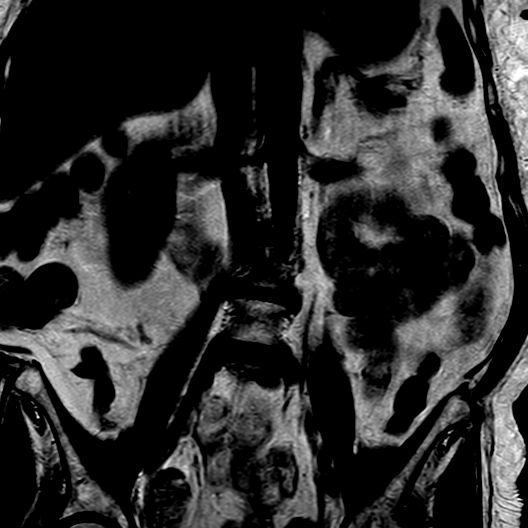

[Series 301: t1_tse_sag · sagittal · 4.0mm · 0.44mm/px · 5 of 15 slices shown]
[im 1/15]
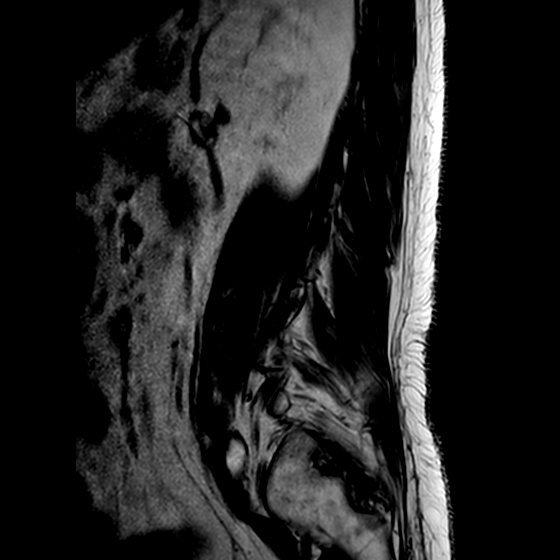
[im 3/15]
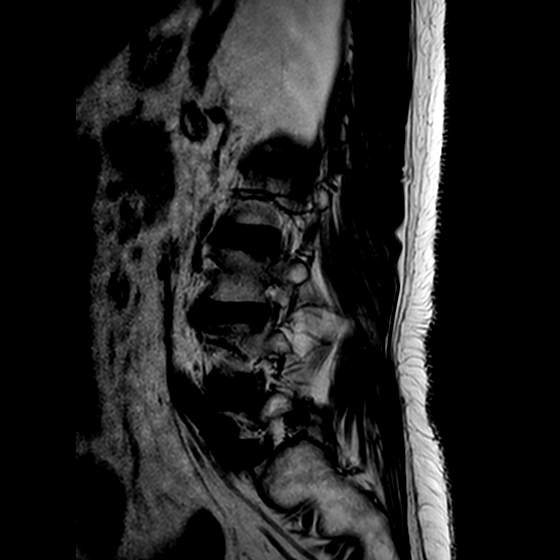
[im 6/15]
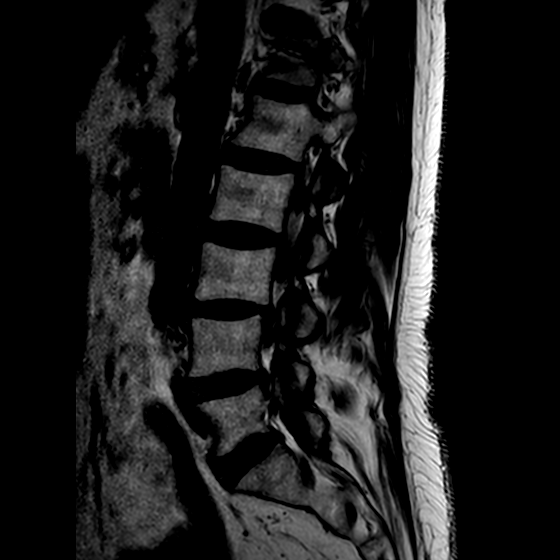
[im 9/15]
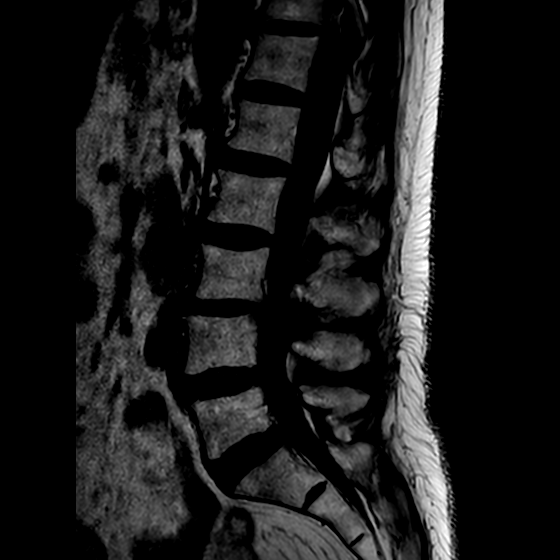
[im 15/15]
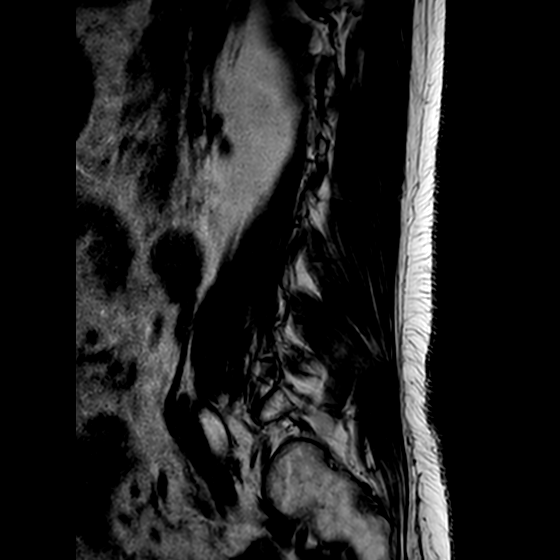

[Series 701: T1 · axial · 4.0mm · 0.38mm/px · z∈[-104,+82]mm · 9 of 35 slices shown]
[im 1/35]
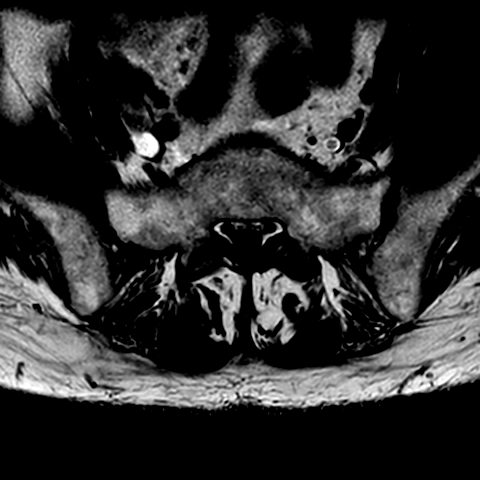
[im 5/35]
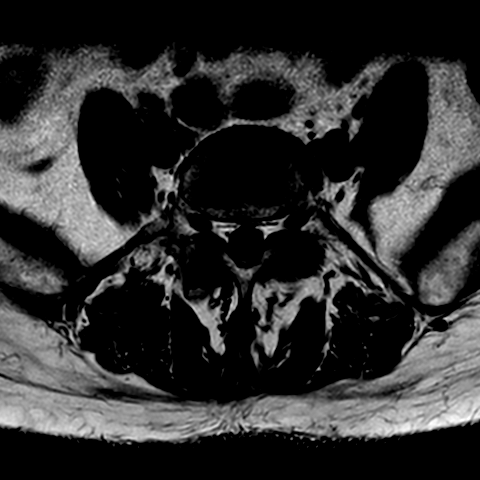
[im 10/35]
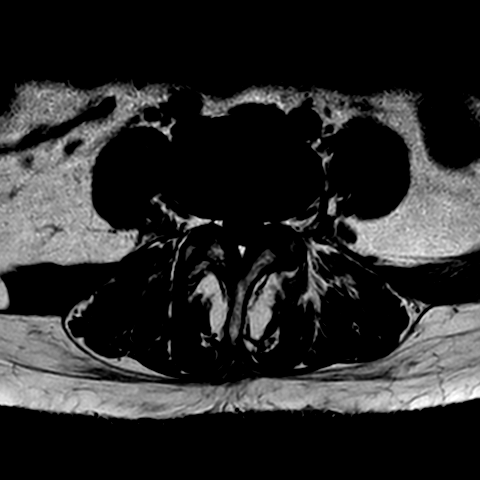
[im 15/35]
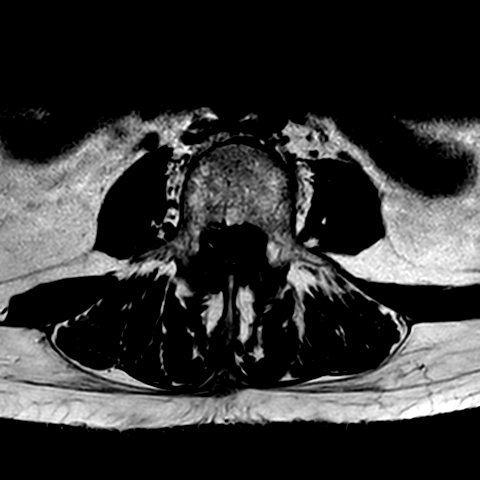
[im 18/35]
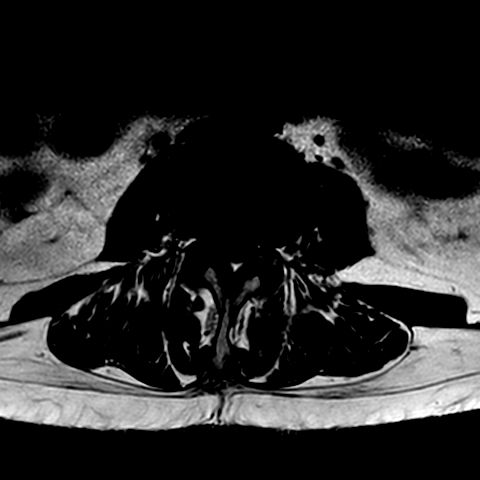
[im 20/35]
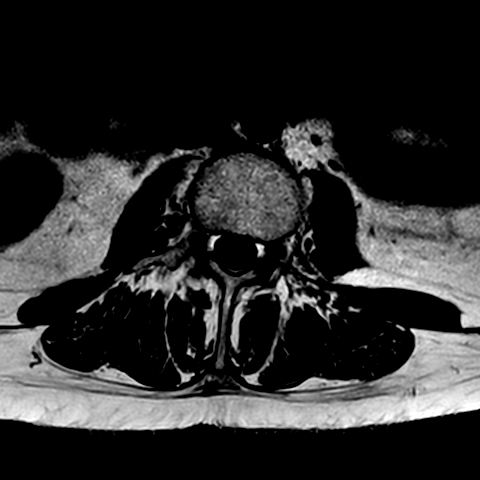
[im 25/35]
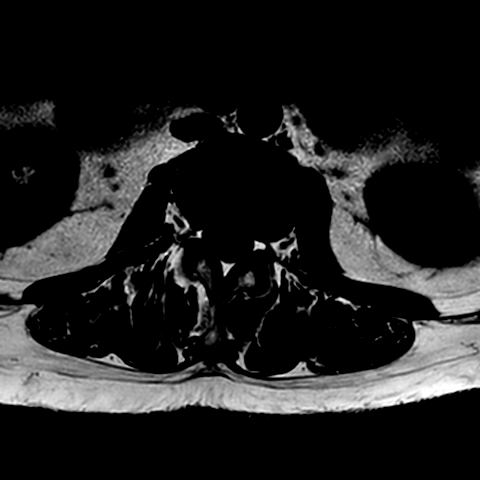
[im 30/35]
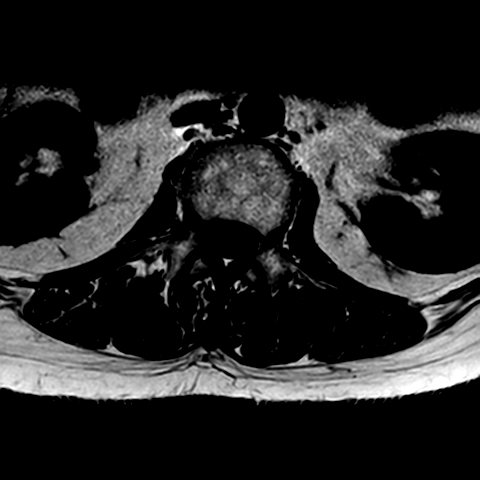
[im 35/35]
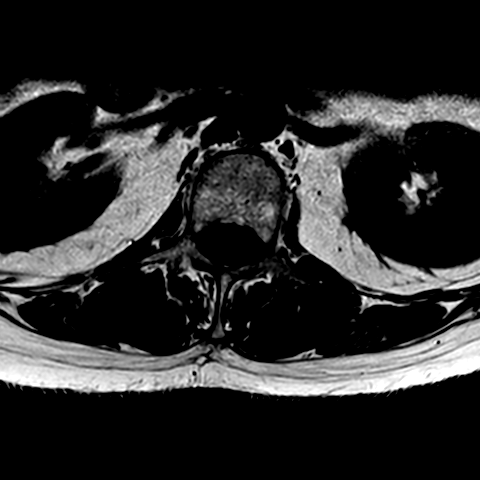

[22 of 48 positions shown; findings below may reference images not displayed]

FINDINGS: Lumbar vertebral heights are intact. There is mild lumbar 
levoscoliosis. There is moderate narrowing on the right L3-4 disc interspace, 
and there is one-1 mm retrolisthesis is mild reactive endplate edema along the 
right posterior L3-4 interspace. 
At L5-S1 the canal and foramina are open. There are moderate facet changes. 
At L4-5 the disc margin is intact. There is moderate facet change with 
ligamentous thickening. Mild canal stenosis, axial image 7. Foramina are open. 
At L3-4 there is moderate to marked canal stenosis due to broad-based disc 
bulge/extrusion and ligamentous thickening, axial image 12. There is mild to 
moderate right, mild left foraminal stenosis. 
At L2-3 and L1-2 the canal and foramina are open. There is mild right 
paracentral disc bulge at L1-2 without nerve impingement.
IMPRESSION: Moderate to marked canal stenosis at L3-4 due to broad-based disc 
bulge/extrusion and facet/ligamentous hypertrophy. There is slight 
retrolisthesis at this level. Mild reactive endplate edema along the right 
posterior L3-4 interspace. 
Degenerative changes elsewhere without significant stenosis. There is mild 
lumbar levoscoliosis.

## 2020-09-04 ENCOUNTER — Ambulatory Visit (HOSPITAL_BASED_OUTPATIENT_CLINIC_OR_DEPARTMENT_OTHER): Payer: Self-pay | Admitting: "Endocrinology

## 2020-09-04 NOTE — Telephone Encounter (Signed)
Regarding: Pt information   ----- Message from Zola Button Horton sent at 09/04/2020  1:54 PM EDT -----  Zadie Rhine, MD    The pt called in and stated that her blood sugar has been running high in the afternoon 250-350 and the pt does not  need you to refill the [employer] until the Dr decides, if the dosage needs to be increased. The pt also stated that Wednesday 09/06/20 is her last dose. Please call and advise.

## 2020-09-22 ENCOUNTER — Encounter (HOSPITAL_BASED_OUTPATIENT_CLINIC_OR_DEPARTMENT_OTHER): Payer: Self-pay | Admitting: "Endocrinology

## 2020-09-28 NOTE — Progress Notes (Signed)
[place] [facility]  RETURN VISIT    Chief Complaint:  LADA treated as type II DM  Referring Provider: No Pcp  PCP: No Pcp     HPI:   Isabella Terrell is a 65 y.o. 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     Past history:  -Diagnosed with type II diabetes mellitus about 25 years ago after presenting to her PCP with polyuria  -Has never been hospitalized for high or low sugars. Has never been in DKA  -Was having multiple UTI and yeast infections so her gynecologist checked her HgA1C on 11/21/16 which was elevated to 12.6%. No longer having frequent UTIs  -When established here, GAD and islet cell antibodies were checked to evaluate for type I DM with GAD antibodies coming back positive likely indicating LADA however patient also has insulin resistance in addition to detectable C-peptide 2.0 in 08/2019 so currently treating as type II DM  -She has a [employer] first 7 months in [place] had good sugars. In April, lost reader. Off Wegovy for 3 weeks almost 4 weeks. Will be going back to [place] 10/21/2020 and back at Christmas.     I uploaded, reviewed, interpreted, and made recommendations based on patient's CGM data.  -In target 64%, high 20%, very high 16%, low 0%, very low 0%    -Most recent HgA1C:  Lab Results   Component Value Date    HA1C 7.3 (H) 03/23/2020     CURRENT ANTI-DIABETIC REGIMEN:  Patient is adherent to meds   Lantus 20 units qPM   Humalog- currently taking 1-2 times a day (typically 12-14 units); sometimes takes an extra dose 6-8 units at bedtime   Wegovy 2.4 mg weekly (pharmacy wouldn't refill so has been off for 3-4 weeks)   Jardiance 25 mg daily   Metformin-XR 1000 mg BID     PREVIOUS ANTI-DIABETIC MEDS:   Januvia- D/Ced to switch to insulin   Metformin (IR)- D/Ced to switch to insulin     BLOOD GLUCOSE TRENDS: Testing BG 10-15 times per day via  Freestyle Libre  Time Readings   Before Breakfast 90-120s   Before Lunch 150-200s   Before Supper 200-290s, some 300s   Bedtime  130-180s       HYPOGLYCEMIA EVALUATION:  Is it occurring? Symptoms? None recently; symptoms include feeling weak   Hypoglycemic Event: (Date/Time) None recently   Treatment: Food, juice   Assisted or Unassisted: Unassisted   Hypoglycemic unawareness? No; feels weird when sugars in 70s   -Glucagon kit: Yes    Insurance stopped covering Contrave.     DIABETIC COMPLICATIONS:  Microvascular Complications:    Peripheral/Autonomic Neuropathy: +Mild peripheral neuropathy, No numbness/tingling but having some muscle cramps. On Neurontin 600 qPM. Was not able to see Podiatry- would prefer to see one in [place]   Retinopathy: Last eye exam 07/2020. Has diabetic retinopathy requiring eye injections   Renal: Last GFR:   Lab Results   Component Value Date    GFR 90 03/23/2020      Microalb/crt ratio:  Urine Microalb/cr ratio   Lab Results   Component Value Date/Time    MICALBCRERAT 7.9 03/04/2019 01:08 PM         On ACEI/ARB: Lisinopril 2.5 mg daily    Macrovascular Complications:   Hx of CAD: No   Hx of statin/ASA: On Lipitor, no  aspirin; SGLT2 or GLP-1 therapy? No SGLT-2; currently on Ozempic (now [place]) and Jardiance   Hx of Stroke: No   Hx of HTN: Yes   Hx of HLD: Yes    Wt Readings from Last 15 Encounters:   10/02/20 71.4 kg (157 lb 6.5 oz)   03/23/20 73.6 kg (162 lb 4.1 oz)   09/09/19 78 kg (171 lb 15.3 oz)   03/04/19 77.6 kg (171 lb 1.2 oz)   09/08/18 79.7 kg (175 lb 11.3 oz)   03/03/18 80.1 kg (176 lb 9.4 oz)   11/11/17 76.6 kg (168 lb 14 oz)   07/25/17 74.6 kg (164 lb 7.4 oz)   07/15/17 74.9 kg (165 lb 2 oz)   03/11/17 74.7 kg (164 lb 10.9 oz)   01/16/17 69.9 kg (154 lb)   01/16/17 70.3 kg (154 lb 15.7 oz)   12/17/16 71.1 kg (156 lb 12 oz)   10/12/12 67.5 kg (148 lb 13 oz)   09/23/12 68.9 kg (152 lb)     REVIEW OF SYSTEMS:  ROS: All systems reviewed and negative except for as  above.    Past Medical History:   Diagnosis Date   . Diabetic retinopathy (CMS [facility])    . HLD (hyperlipidemia)    . HTN (hypertension)    . LADA (latent autoimmune diabetes in adults), managed as type 1 (CMS [facility])      Past Surgical History:   Procedure Laterality Date   . ENDOMETRIAL ABLATION     . HX TONSILLECTOMY     . HX WISDOM TEETH EXTRACTION     . TRIGGER FINGER RELEASE       Social History     Tobacco Use   . Smoking status: Never Smoker   . Smokeless tobacco: Never Used   Substance Use Topics   . Alcohol use: No   . Drug use: No        Family Medical History:     Problem Relation (Age of Onset)    Coronary Artery Disease Mother    Diabetes Father    Diabetes type II Father, Brother, Paternal Uncle, Sister    Hypothyroidism Mother, Sister    Multiple Sclerosis Brother    Other Mother, Father          Current Outpatient Medications   Medication Sig   . aspirin (ECOTRIN) 81 mg Oral Tablet, Delayed Release (E.C.) Take 81 mg by mouth Once a day   . atorvastatin (LIPITOR) 20 mg Oral Tablet TAKE 1 TABLET BY MOUTH EVERY DAY   . Blood Sugar Diagnostic (ONETOUCH VERIO) Strip To check sugars 4 times daily as directed   . cholecalciferol, Vitamin D3, 125 mcg (5,000 unit) Oral Tablet Take 5,000 Units by mouth Once a day   . empagliflozin (JARDIANCE) 25 mg Oral Tablet Take 1 tablet daily by mouth.   . flash glucose scanning reader (FREESTYLE LIBRE 14 DAY READER) Does not apply Misc To test blood sugars E10.9   . flash glucose sensor (FREESTYLE LIBRE 14 DAY SENSOR) Does not apply Kit To test blood sugars E10.9   . gabapentin (NEURONTIN) 300 mg Oral Capsule TAKE 2 CAPSULES BY MOUTH IN THE EVENING   . glucagon (GLUCAGEN) 1 mg/mL Injection Recon Soln 1 mg by Intramuscular route Once, as needed for Other (Blood sugar less than 70 and cannot eat) for up to 1 dose   . insulin glargine (LANTUS SOLOSTAR U-100 INSULIN) 100 unit/mL Subcutaneous Insulin Pen Take up to 50 units nightly   .  insulin lispro (HUMALOG KWIKPEN INSULIN) 100  unit/mL Subcutaneous Insulin Pen Inject up to 40 units daily in 3 divided doses.   Marland Kitchen insulin pen needles (PEN NEEDLE) 32 gauge x 5/32" Needle To inject 4 times per day   . lancets (ONE TOUCH DELICA) 33 gauge Misc To check sugars 4 times daily as directed   . lisinopriL (PRINIVIL) 2.5 mg Oral Tablet Take 1 Tablet (2.5 mg total) by mouth Once a day   . metFORMIN (GLUCOPHAGE XR) 500 mg Oral Tablet Sustained Release 24 hr Take 4 Tablets (2,000 mg total) by mouth Every night   . omeprazole (PRILOSEC) 40 mg Oral Capsule, Delayed Release(E.C.) Take 40 mg by mouth Once a day   . semaglutide, weight loss, (WEGOVY) 2.4 mg/0.75 mL Subcutaneous Pen Injector Inject 0.75 mL (2.4 mg total) under the skin Every 7 days   . vits A,C,E/zinc/copper (VISION-VITE PRESERVE ORAL) Take 1 Tab by mouth Twice daily     No Known Allergies    PHYSICAL EXAM:   Vitals:    10/02/20 1106   BP: 132/63   Pulse: 93   Temp: 35 C (95 F)   TempSrc: Thermal Scan   SpO2: 96%   Weight: 71.4 kg (157 lb 6.5 oz)   Height: 1.549 m (5' 1" )   BMI: 29.8    General: Well appearing female in no acute distress.  Psychiatric: Normal mood and affect  Neuro: Alert and oriented x 3. Speech fluent. Cranial nerves II-XII intact. No focal deficits noted.   HEENT: Head normocephalic, atraumatic. PERRLA, EOMI, conjunctiva clear. Wearing a face mask.   Thyroid/Neck: Trachea midline  Lungs: Normal respiratory effort.   Cardiac: Regular rate and rhythm  Abdomen: Non-distended.   Extremities: No edema or erythema.  Skin: No visible rashes.       LABS:  Lab Results   Component Value Date/Time    HA1C 7.3 (H) 03/23/2020 08:46 AM    SODIUM 142 03/23/2020 08:46 AM    POTASSIUM 4.4 03/23/2020 08:46 AM    CHLORIDE 104 03/23/2020 08:46 AM    CO2 31 03/23/2020 08:46 AM    BUN 12 03/23/2020 08:46 AM    CREATININE 0.71 03/23/2020 08:46 AM    MICALBCRERAT 7.9 03/04/2019 01:08 PM    AST 31 08/28/2018 09:10 AM    ALT 41 08/28/2018 09:10 AM     12/24/2016 1:28 PM - Edi, Reference Lab Results  In      Component Results     Component Value Ref Range & Units Status    GAD65 AB ASSAY 0.12 (H) <=0.02 nmol/L Final     12/24/2016 1:30 PM - Edi, Reference Lab Results In      Component Results     Component Value Ref Range & Units Status    ISLET ANTIGEN 2 (IA-2) ANTIBODY, SERUM 0.00 <=0.02 nmol/L Final        Ref. Range 09/09/2019 09:05   TSH Latest Ref Range: 0.430 - 3.550 uIU/mL 1.853   C-PEPTIDE Latest Ref Range: 0.9 - 7.1 ng/mL 2.0     ASSESSMENT:  DAIL MEECE is a 65 y.o. 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      PLAN:  LADA with Insulin Resistance complicated by Frequent Evening Hyperglycemia and Occasional Morning Hypoglycemia  -Complicated by frequent evening hyperglycemia, occasional morning hypoglycemia, mild peripheral neuropathy, past microalbuminuria (now normal as of 2018), diabetic retinopathy  and macrovascular diseases including HTN, HLD  -Anti-GAD and anti-islet cell antibodies to evaluate for type 1 DM checked and positive for GAD antibody however patient also has insulin resistance and requires higher doses of insulin which has contributed to weight gain  -C-peptide detectable at 2.0 in 08/2019   -Most recent HgA1C 7.3% in 02/2020; will repeat today. Urine microalbumin/creatinine ratio 0.6 in 02/2019, will repeat. Currently on lisinopril 2.5 mg daily  -Annual diabetes foot exam done 09/09/2019; provided external referral to Podiatry so she can see them in [place] (will be leaving next month) as patient requests assistance with cutting toenails in setting of peripheral neuropathy  -Most recent diabetic eye exam 07/2019. Has diabetic retinopathy requiring eye injections  -Hypoglycemia occasionally occuring. Has glucagon injection  -Reviewed blood sugars via Libre CGM download. AM and before bedtime sugars well-controlled but having hyperglycemia later in the  day  -Continue metformin-XR 1000 mg BID and Jardiance 25 mg daily  -Will restart Wegovy 2.4 mg weekly; if insurance doesn't cover or unable to obtain then will restart Ozempic 2 mg weekly  -Continue Lantus 20 units qPM  -Continue Humalog 12 units with lunch and dinner. Instructed her to message me in 1-2 weeks after restarting GLP-1 agonist to see if we need to lower Lantus or Humalog doses  -Has glucometer, test strips, lancets, pen needles and Libre sensors  -Most recent BMP 02/2020 with creatinine 0.71 and GFR 90, will repeat today  -Continue to check sugars 4x/day via FreeStyle Libre and to bring sugar logs and glucometer to clinic appointments    Peripheral Neuropathy  -Continue Neurontin 600 mg qPM  -Referred to Podiatry for assistance with cutting toenails in setting of neuropathy however would prefer to see someone in [place]     Hgba1c Goal <7%   Labs today: HgA1C, BMP, urine microalbumin/Cr ratio   Yearly eye and daily foot exams discussed   Symptoms of potential hypoglycemia discussed and treatment of hypoglycemia discussed   Return visit in 6 months or earlier if needed      Right Thyroid Nodule  -TSH normal in 08/2019. Thyroid ultrasound done 03/06/17 without thyroid nodules with homogenous thyroid texture  -Repeat ultrasound 08/2019 showed only a small 0.4 cm right isoechoic nodule, will repeat ultrasound in 2 years (around 08/2021)    Orders Placed This Encounter   . HGA1C (HEMOGLOBIN A1C WITH EST AVG GLUCOSE)   . BASIC METABOLIC PANEL   . MICROALBUMIN/CREATININE RATIO, URINE, RANDOM   . semaglutide, weight loss, (WEGOVY) 2.4 mg/0.75 mL Subcutaneous Pen Injector     Keith Rake, MD, MPH 10/02/2020, 13:23

## 2020-10-02 ENCOUNTER — Ambulatory Visit (HOSPITAL_BASED_OUTPATIENT_CLINIC_OR_DEPARTMENT_OTHER): Payer: Medicare Other

## 2020-10-02 ENCOUNTER — Encounter (HOSPITAL_BASED_OUTPATIENT_CLINIC_OR_DEPARTMENT_OTHER): Payer: Self-pay | Admitting: "Endocrinology

## 2020-10-02 ENCOUNTER — Ambulatory Visit: Payer: Medicare Other | Attending: "Endocrinology | Admitting: "Endocrinology

## 2020-10-02 VITALS — BP 132/63 | HR 93 | Temp 95.0°F | Ht 61.0 in | Wt 157.4 lb

## 2020-10-02 DIAGNOSIS — E139 Other specified diabetes mellitus without complications: Secondary | ICD-10-CM

## 2020-10-02 DIAGNOSIS — Z794 Long term (current) use of insulin: Secondary | ICD-10-CM

## 2020-10-02 DIAGNOSIS — E669 Obesity, unspecified: Secondary | ICD-10-CM | POA: Insufficient documentation

## 2020-10-02 LAB — BASIC METABOLIC PANEL
ANION GAP: 8 mmol/L (ref 4–13)
BUN/CREA RATIO: 24 — ABNORMAL HIGH (ref 6–22)
BUN: 15 mg/dL (ref 8–25)
CALCIUM: 9.5 mg/dL (ref 8.8–10.2)
CHLORIDE: 103 mmol/L (ref 96–111)
CO2 TOTAL: 29 mmol/L (ref 23–31)
CREATININE: 0.63 mg/dL (ref 0.60–1.05)
ESTIMATED GFR: >90 mL/min/BSA (ref >=60)
GLUCOSE: 206 mg/dL — ABNORMAL HIGH (ref 65–125)
POTASSIUM: 4.3 mmol/L (ref 3.5–5.1)
SODIUM: 140 mmol/L (ref 136–145)

## 2020-10-02 LAB — MICROALBUMIN/CREATININE RATIO, URINE, RANDOM
CREATININE RANDOM URINE: 74 mg/dL (ref 50–100)
MICROALBUMIN RANDOM URINE: 1.3 mg/dL
MICROALBUMIN/CREATININE RATIO RANDOM URINE: 17.6 mg/g (ref <30.0)

## 2020-10-02 MED ORDER — WEGOVY 2.4 MG/0.75 ML SUBCUTANEOUS PEN INJECTOR
2.4000 mg | PEN_INJECTOR | SUBCUTANEOUS | 3 refills | Status: DC
Start: 2020-10-02 — End: 2021-01-12

## 2020-10-02 NOTE — Patient Instructions (Signed)
Continue Jardiance 25 mg daily and metformin-XR 1000 mg twice a day.     Restart Wegovy 2.4 mg weekly; if insurance will not cover, let me know and will restart Ozempic 2 mg weekly.     Continue Lantus 20 units nightly.    For your Humalog, continue 12 units with lunch and dinner for now but message me in 1-2 weeks after restarting Wegovy or Ozempic to see if we need to reduce your dose of Humalog or Lantus.

## 2020-10-03 LAB — HGA1C (HEMOGLOBIN A1C WITH EST AVG GLUCOSE)
ESTIMATED AVERAGE GLUCOSE: 154 mg/dL
HEMOGLOBIN A1C: 7 % — ABNORMAL HIGH (ref 4.0–5.6)

## 2020-10-07 ENCOUNTER — Other Ambulatory Visit (HOSPITAL_BASED_OUTPATIENT_CLINIC_OR_DEPARTMENT_OTHER): Payer: Self-pay | Admitting: "Endocrinology

## 2020-10-07 DIAGNOSIS — E139 Other specified diabetes mellitus without complications: Secondary | ICD-10-CM

## 2020-10-10 NOTE — Telephone Encounter (Signed)
Current Outpatient Medications   Medication Sig   . aspirin (ECOTRIN) 81 mg Oral Tablet, Delayed Release (E.C.) Take 81 mg by mouth Once a day   . atorvastatin (LIPITOR) 20 mg Oral Tablet TAKE 1 TABLET BY MOUTH EVERY DAY   . Blood Sugar Diagnostic (ONETOUCH VERIO) Strip To check sugars 4 times daily as directed   . cholecalciferol, Vitamin D3, 125 mcg (5,000 unit) Oral Tablet Take 5,000 Units by mouth Once a day   . empagliflozin (JARDIANCE) 25 mg Oral Tablet Take 1 tablet daily by mouth.   . flash glucose scanning reader (FREESTYLE LIBRE 14 DAY READER) Does not apply Misc To test blood sugars E10.9   . flash glucose sensor (FREESTYLE LIBRE 14 DAY SENSOR) Does not apply Kit To test blood sugars E10.9   . gabapentin (NEURONTIN) 300 mg Oral Capsule TAKE 2 CAPSULES BY MOUTH IN THE EVENING   . glucagon (GLUCAGEN) 1 mg/mL Injection Recon Soln 1 mg by Intramuscular route Once, as needed for Other (Blood sugar less than 70 and cannot eat) for up to 1 dose   . insulin glargine (LANTUS SOLOSTAR U-100 INSULIN) 100 unit/mL Subcutaneous Insulin Pen Take up to 50 units nightly   . insulin lispro (HUMALOG KWIKPEN INSULIN) 100 unit/mL Subcutaneous Insulin Pen Inject up to 40 units daily in 3 divided doses.   Marland Kitchen insulin pen needles (PEN NEEDLE) 32 gauge x 5/32" Needle To inject 4 times per day   . lancets (ONE TOUCH DELICA) 33 gauge Misc To check sugars 4 times daily as directed   . lisinopriL (PRINIVIL) 2.5 mg Oral Tablet Take 1 Tablet (2.5 mg total) by mouth Once a day   . metFORMIN (GLUCOPHAGE XR) 500 mg Oral Tablet Sustained Release 24 hr Take 4 Tablets (2,000 mg total) by mouth Every night   . omeprazole (PRILOSEC) 40 mg Oral Capsule, Delayed Release(E.C.) Take 40 mg by mouth Once a day   . semaglutide, weight loss, (WEGOVY) 2.4 mg/0.75 mL Subcutaneous Pen Injector Inject 0.75 mL (2.4 mg total) under the skin Every 7 days   . vits A,C,E/zinc/copper (VISION-VITE PRESERVE ORAL) Take 1 Tab by mouth Twice daily     Last dept visit:   10/02/2020  Next pending dept visit: Visit date not found  Rx: MF - Pending Provider.  Merleen Milliner, [place] 10/10/2020, 13:48

## 2020-10-23 ENCOUNTER — Other Ambulatory Visit (HOSPITAL_BASED_OUTPATIENT_CLINIC_OR_DEPARTMENT_OTHER): Payer: Self-pay | Admitting: "Endocrinology

## 2020-10-23 DIAGNOSIS — E139 Other specified diabetes mellitus without complications: Secondary | ICD-10-CM

## 2020-10-25 NOTE — Telephone Encounter (Signed)
Current Outpatient Medications   Medication Sig   . aspirin (ECOTRIN) 81 mg Oral Tablet, Delayed Release (E.C.) Take 81 mg by mouth Once a day   . atorvastatin (LIPITOR) 20 mg Oral Tablet TAKE 1 TABLET BY MOUTH EVERY DAY   . Blood Sugar Diagnostic (ONETOUCH VERIO) Strip To check sugars 4 times daily as directed   . cholecalciferol, Vitamin D3, 125 mcg (5,000 unit) Oral Tablet Take 5,000 Units by mouth Once a day   . empagliflozin (JARDIANCE) 25 mg Oral Tablet Take 1 tablet daily by mouth.   . flash glucose scanning reader (FREESTYLE LIBRE 14 DAY READER) Does not apply Misc To test blood sugars E10.9   . flash glucose sensor (FREESTYLE LIBRE 14 DAY SENSOR) Does not apply Kit To test blood sugars E10.9   . gabapentin (NEURONTIN) 300 mg Oral Capsule TAKE 2 CAPSULES BY MOUTH IN THE EVENING   . glucagon (GLUCAGEN) 1 mg/mL Injection Recon Soln 1 mg by Intramuscular route Once, as needed for Other (Blood sugar less than 70 and cannot eat) for up to 1 dose   . insulin glargine (LANTUS SOLOSTAR U-100 INSULIN) 100 unit/mL Subcutaneous Insulin Pen Take up to 50 units nightly   . insulin lispro (HUMALOG KWIKPEN INSULIN) 100 unit/mL Subcutaneous Insulin Pen Inject up to 40 units daily in 3 divided doses.   Marland Kitchen insulin pen needles (PEN NEEDLE) 32 gauge x 5/32" Needle To inject 4 times per day   . lancets (ONE TOUCH DELICA) 33 gauge Misc To check sugars 4 times daily as directed   . lisinopriL (PRINIVIL) 2.5 mg Oral Tablet Take 1 Tablet (2.5 mg total) by mouth Once a day   . metFORMIN (GLUCOPHAGE XR) 500 mg Oral Tablet Sustained Release 24 hr TAKE 4 TABLETS (2,000 MG TOTAL) BY MOUTH EVERY NIGHT   . omeprazole (PRILOSEC) 40 mg Oral Capsule, Delayed Release(E.C.) Take 40 mg by mouth Once a day   . semaglutide, weight loss, (WEGOVY) 2.4 mg/0.75 mL Subcutaneous Pen Injector Inject 0.75 mL (2.4 mg total) under the skin Every 7 days   . vits A,C,E/zinc/copper (VISION-VITE PRESERVE ORAL) Take 1 Tab by mouth Twice daily     Last dept visit:   10/02/2020  Next pending dept visit: 03/23/2021  jardiance  Bethanie Dicker, RN 10/25/2020, 15:50

## 2020-11-14 ENCOUNTER — Other Ambulatory Visit (HOSPITAL_BASED_OUTPATIENT_CLINIC_OR_DEPARTMENT_OTHER): Payer: Self-pay | Admitting: "Endocrinology

## 2020-11-14 DIAGNOSIS — E785 Hyperlipidemia, unspecified: Secondary | ICD-10-CM

## 2020-11-14 DIAGNOSIS — E139 Other specified diabetes mellitus without complications: Secondary | ICD-10-CM

## 2021-01-09 ENCOUNTER — Other Ambulatory Visit (HOSPITAL_BASED_OUTPATIENT_CLINIC_OR_DEPARTMENT_OTHER): Payer: Self-pay | Admitting: "Endocrinology

## 2021-01-11 NOTE — Telephone Encounter (Signed)
No longer on this dose per chart. Now on 2.4mg  Wegovy. Refused.Isabella Terrell  01/11/2021, 14:30

## 2021-01-12 ENCOUNTER — Other Ambulatory Visit (HOSPITAL_BASED_OUTPATIENT_CLINIC_OR_DEPARTMENT_OTHER): Payer: Self-pay | Admitting: "Endocrinology

## 2021-01-12 ENCOUNTER — Other Ambulatory Visit: Payer: Self-pay

## 2021-01-12 DIAGNOSIS — E139 Other specified diabetes mellitus without complications: Secondary | ICD-10-CM

## 2021-01-12 DIAGNOSIS — E669 Obesity, unspecified: Secondary | ICD-10-CM

## 2021-01-12 MED ORDER — WEGOVY 2.4 MG/0.75 ML SUBCUTANEOUS PEN INJECTOR
2.4000 mg | PEN_INJECTOR | SUBCUTANEOUS | 3 refills | Status: DC
Start: 2021-01-12 — End: 2021-01-16

## 2021-01-12 MED ORDER — INSULIN GLARGINE (U-100) 100 UNIT/ML (3 ML) SUBCUTANEOUS PEN
PEN_INJECTOR | SUBCUTANEOUS | 3 refills | Status: DC
Start: 2021-01-12 — End: 2022-02-21

## 2021-01-12 NOTE — Telephone Encounter (Signed)
Rx on file for wegovy 2.4 mg dose. Patient message above requesting both wegovy and ozempic. Per office ntoe - -Will restart Wegovy 2.4 mg weekly; if insurance doesn't cover or unable to obtain then will restart Ozempic 2 mg weekly    Sent message for clarification. Christie Nottingham, RN  01/12/2021, 08:04

## 2021-01-12 NOTE — Telephone Encounter (Signed)
Regarding: refill  ----- Message from Sherre Lain sent at 01/11/2021  4:30 PM EDT -----  Doctor Name: Dr. Drusilla Kanner      Date of last appointment: 10/02/20    Next scheduled visit: 03/23/21    Medication Requested:   insulin glargine (LANTUS SOLOSTAR U-100 INSULIN) 100 unit/mL Subcutaneous Insulin Pen 50 mL 2 09/09/2019  ========only taking 14 units daily so doesn't needs so much  Sig: Take up to 50 units nightly   Sent to pharmacy as: Lantus Solostar U-100 Insulin 100 unit/mL (3 mL) subcutaneous pen (insulin glargine)   Class: E-Rx   Non-formulary Exception Code: RXHUB/No Formulary Info Available   E-Prescribing Status: Receipt confirmed by pharmacy (09/09/2019 8:44 AM EDT)     semaglutide, weight loss, (WEGOVY) 2.4 mg/0.75 mL Subcutaneous Pen Injector 9 mL 3 10/02/2020    Sig - Route: Inject 0.75 mL (2.4 mg total) under the skin Every 7 days - Subcutaneous   Sent to pharmacy as: Wegovy 2.4 mg/0.75 mL subcutaneous pen injector (semaglutide (weight loss))   Class: E-Rx   Non-formulary Exception Code: RXHUB/No Formulary Info Available   E-Prescribing Status: Receipt confirmed by pharmacy (10/02/2020 3:13 PM EDT)   Prior authorization: Closed - Prior Authorization not required for patient/medication     Ozempic needs 3 months supply at a time and she is taking 2 units         Preferred Pharmacy:    [id number] [address]    [address] [place] [address]    Phone: [phone] Fax: [phone]    Hours: Not open 24 hours

## 2021-01-12 NOTE — Telephone Encounter (Signed)
Sent Wegovy 2.4 mg weekly

## 2021-01-16 MED ORDER — WEGOVY 2.4 MG/0.75 ML SUBCUTANEOUS PEN INJECTOR
2.4000 mg | PEN_INJECTOR | SUBCUTANEOUS | 3 refills | Status: DC
Start: 2021-01-16 — End: 2021-01-31

## 2021-01-16 NOTE — Telephone Encounter (Signed)
Summary: rx sent to wrong pharmacy    [name], [name], MD     Pt calling about her rx for semaglutide, weight loss, (WEGOVY) 2.4 mg/0.75 mL Subcutaneous Pen Injector. It was sent to the incorrect pharmacy. It needs to be sent to her pharmacy in [place]. She also stating she has been having trouble getting her Wegovy filled as it is out of stock and wondering if a rx for Ozempic can be sent in case the pharmacy does not have the [facility]. Please call pt to advise. Thank you!       [id number] [address]   Phone: [phone]   Fax: [phone]                 Call History     Type Contact Phone/Fax User   01/16/2021 01:09 PM EDT Phone ([address] Isabella Terrell, Isabella Terrell (Self) [phone] Judie Petit) [name]     Pending [phone] to different pharmacy.    Donnie Mesa, RN  01/16/2021, 13:15

## 2021-01-16 NOTE — Addendum Note (Signed)
Addended by: Uvaldo Bristle on: 01/16/2021 01:17 PM     Modules accepted: Orders

## 2021-01-17 ENCOUNTER — Other Ambulatory Visit (HOSPITAL_BASED_OUTPATIENT_CLINIC_OR_DEPARTMENT_OTHER): Payer: Self-pay | Admitting: "Endocrinology

## 2021-01-17 DIAGNOSIS — E139 Other specified diabetes mellitus without complications: Secondary | ICD-10-CM

## 2021-01-18 NOTE — Telephone Encounter (Signed)
Rx: Gabapentin 300 MG  [facility]  [place] [place]  Fax: [phone]  Neita Goodnight, MA  01/18/2021, 10:38

## 2021-01-22 ENCOUNTER — Other Ambulatory Visit (HOSPITAL_BASED_OUTPATIENT_CLINIC_OR_DEPARTMENT_OTHER): Payer: Self-pay | Admitting: "Endocrinology

## 2021-01-22 DIAGNOSIS — E139 Other specified diabetes mellitus without complications: Secondary | ICD-10-CM

## 2021-01-22 NOTE — Telephone Encounter (Signed)
One year supply sent in March.    Donnie Mesa, RN  01/22/2021, 07:22

## 2021-01-22 NOTE — Telephone Encounter (Signed)
Regarding: refill  ----- Message from Burnadette Pop sent at 01/19/2021  4:30 PM EDT -----  Doctor Name: Dr. Lysle Rubens      Date of last appointment:10/02/20    Next scheduled visit: 03/23/21    Medication Requested:    flash glucose sensor (FREESTYLE LIBRE 14 DAY SENSOR) Does not apply Kit 6 Each 3 06/19/2020    Sig: To test blood sugars E10.9   Sent to pharmacy as: FreeStyle Libre 14 Day Sensor kit (flash glucose sensor)   Class: E-Rx   Non-formulary Exception Code: XQJJH/ER Formulary Info Available   E-Prescribing Status: Receipt confirmed by pharmacy (06/19/2020 8:47 AM EDT)     Out early took one off for mri        Preferred Pharmacy:    [id number] [place] [place]   [address] [address]   Phone: [phone]: [phone]   Hours: Not open 24 hours

## 2021-01-26 ENCOUNTER — Ambulatory Visit (HOSPITAL_BASED_OUTPATIENT_CLINIC_OR_DEPARTMENT_OTHER): Payer: Self-pay | Admitting: "Endocrinology

## 2021-01-26 MED ORDER — FREESTYLE LIBRE 14 DAY SENSOR KIT
PACK | 3 refills | Status: DC
Start: 2021-01-26 — End: 2021-10-05

## 2021-01-26 NOTE — Addendum Note (Signed)
Addended by: Christie Nottingham NICOLE on: 01/26/2021 02:14 PM     Modules accepted: Orders

## 2021-01-26 NOTE — Telephone Encounter (Signed)
Message from Frederico Hamman sent at 01/26/2021 1:28 PM EDT    Summary: pt out of meds    [name], Dorian Furnace, MD     Pt called in stating that they need a new script for [place] 2 sensors as they are out and need to monitor blood sugars closely for an upcoming surgery. States that also [facility] is going to need both an order turned in and a prior auth called in. [facility] gave the following phone numbers for the wegovy prior auth through [place]. Please call pt to advise. Thank you!     [facility] Phone to submit [facility] order: [phone]     [facility] Phone for [place]: [phone].     Medications:     flash glucose sensor (FREESTYLE LIBRE 14 DAY SENSOR) Does not apply Kit     semaglutide, weight loss, (WEGOVY) 2.4 mg/0.75 mL Subcutaneous Pen Injector     Preferred Pharmacy    [id number] [place] [place] [id number]  Phone: [phone]: [phone]  Hours: Not open 24 hours               Called pharmacy who stated no rx on file for [place] Pending. Stated rx on file for wegovy, needs auth. Clicked "request prior authorization" so prescription will be automatically be sent to prior authorization basket for completion.   .Caroline Sauger RN  01/26/2021, 13:34

## 2021-01-26 NOTE — Telephone Encounter (Addendum)
Opened in error.Christie Nottingham, RN  01/26/2021, 14:14

## 2021-01-29 ENCOUNTER — Other Ambulatory Visit (HOSPITAL_BASED_OUTPATIENT_CLINIC_OR_DEPARTMENT_OTHER): Payer: Self-pay | Admitting: "Endocrinology

## 2021-01-30 NOTE — Telephone Encounter (Addendum)
Wegovy on file.Christie Nottingham, RN  01/30/2021, 08:31

## 2021-01-31 ENCOUNTER — Other Ambulatory Visit (HOSPITAL_BASED_OUTPATIENT_CLINIC_OR_DEPARTMENT_OTHER): Payer: Self-pay | Admitting: "Endocrinology

## 2021-01-31 DIAGNOSIS — E139 Other specified diabetes mellitus without complications: Secondary | ICD-10-CM

## 2021-01-31 MED ORDER — DULAGLUTIDE 1.5 MG/0.5 ML SUBCUTANEOUS PEN INJECTOR
1.5000 mg | PEN_INJECTOR | SUBCUTANEOUS | 0 refills | Status: DC
Start: 2021-01-31 — End: 2021-03-02

## 2021-01-31 MED ORDER — DULAGLUTIDE 3 MG/0.5 ML SUBCUTANEOUS PEN INJECTOR
3.0000 mg | PEN_INJECTOR | SUBCUTANEOUS | 0 refills | Status: AC
Start: 2021-01-31 — End: 2021-02-14

## 2021-01-31 MED ORDER — DULAGLUTIDE 4.5 MG/0.5 ML SUBCUTANEOUS PEN INJECTOR
4.5000 mg | PEN_INJECTOR | SUBCUTANEOUS | 3 refills | Status: DC
Start: 2021-01-31 — End: 2021-04-19

## 2021-01-31 NOTE — Telephone Encounter (Signed)
Patient asking to start Trulicity per provider message.    Routing to provider.    Donnie Mesa, RN  01/31/2021, 11:42

## 2021-01-31 NOTE — Telephone Encounter (Signed)
[name], [name], MD  P [facility]  [place] and [place] are on back order everywhere but some pharmacies have been able to get it the past week or two. Please instruct patient to call around to different pharmacies to see if they have any Ozempic or Wegovy in stock and if not, if they are able to order it. Please let me know what dose she will need (I see in her chart that her dose of Wegovy is 2.4 mg but this note said 1 mg). If she is unable to find anywhere that has either of them, we will try switching to Trulicity.     Gordy Levan, MD, MPH 01/31/2021, 09:18   Sent MyChart    Donnie Mesa, RN  01/31/2021, 09:22

## 2021-01-31 NOTE — Telephone Encounter (Signed)
Message from Lona Millard sent at 01/31/2021 8:39 AM EST    Summary: Call back and medication request.    Zadie Rhine, MD     Stated that on wednesdays she takes her Ozempic. The medication is on back order and would like to have 1mg  until [facility] can get processed. the patient is requesting that the Dr call her personally about all this.   Preferred Pharmacy       [id number] [address]   [address] [place]   Phone: [phone] Fax: [phone]   Hours: Not open 24 hours                 Call History     Type Contact Phone/Fax User   01/31/2021 08:35 AM EST Phone ([address]) Felipa, [place] (Self) [phone]) Robinson, [place], RN  01/31/2021, 08:41

## 2021-03-02 ENCOUNTER — Other Ambulatory Visit (HOSPITAL_BASED_OUTPATIENT_CLINIC_OR_DEPARTMENT_OTHER): Payer: Self-pay | Admitting: "Endocrinology

## 2021-03-02 DIAGNOSIS — E139 Other specified diabetes mellitus without complications: Secondary | ICD-10-CM

## 2021-03-02 NOTE — Telephone Encounter (Signed)
Current Outpatient Medications   Medication Sig    aspirin (ECOTRIN) 81 mg Oral Tablet, Delayed Release (E.C.) Take 81 mg by mouth Once a day    atorvastatin (LIPITOR) 20 mg Oral Tablet TAKE 1 TABLET BY MOUTH EVERY DAY    Blood Sugar Diagnostic (ONETOUCH VERIO) Strip To check sugars 4 times daily as directed    cholecalciferol, Vitamin D3, 125 mcg (5,000 unit) Oral Tablet Take 5,000 Units by mouth Once a day    dulaglutide 4.5 mg/0.5 mL Subcutaneous Pen Injector Inject 0.5 mL (4.5 mg total) under the skin Every 7 days    empagliflozin (JARDIANCE) 25 mg Oral Tablet TAKE 1 TABLET BY MOUTH EVERY DAY    flash glucose scanning reader (FREESTYLE LIBRE 14 DAY READER) Does not apply Misc To test blood sugars E10.9    flash glucose sensor (FREESTYLE LIBRE 14 DAY SENSOR) Does not apply Kit To test blood sugars E10.9    gabapentin (NEURONTIN) 300 mg Oral Capsule TAKE 2 CAPSULES BY MOUTH IN THE EVENING    glucagon (GLUCAGEN) 1 mg/mL Injection Recon Soln 1 mg by Intramuscular route Once, as needed for Other (Blood sugar less than 70 and cannot eat) for up to 1 dose    insulin glargine (LANTUS SOLOSTAR U-100 INSULIN) 100 unit/mL Subcutaneous Insulin Pen Take 20 units nightly    insulin lispro (HUMALOG KWIKPEN INSULIN) 100 unit/mL Subcutaneous Insulin Pen Inject up to 40 units daily in 3 divided doses.    insulin pen needles (PEN NEEDLE) 32 gauge x 5/32" Needle To inject 4 times per day    lancets (ONE TOUCH DELICA) 33 gauge Misc To check sugars 4 times daily as directed    lisinopriL (PRINIVIL) 2.5 mg Oral Tablet TAKE 1 TABLET (2.5 MG TOTAL) BY MOUTH ONCE A DAY    metFORMIN (GLUCOPHAGE XR) 500 mg Oral Tablet Sustained Release 24 hr TAKE 4 TABLETS (2,000 MG TOTAL) BY MOUTH EVERY NIGHT    omeprazole (PRILOSEC) 40 mg Oral Capsule, Delayed Release(E.C.) Take 40 mg by mouth Once a day    vits A,C,E/zinc/copper (VISION-VITE PRESERVE ORAL) Take 1 Tab by mouth Twice daily     Last dept visit:  10/02/2020  Next pending  dept visit: 03/23/2021  Refill request for trulicity, pended to provider   Loyce Dys 03/02/2021, 15:57

## 2021-03-21 ENCOUNTER — Encounter (HOSPITAL_BASED_OUTPATIENT_CLINIC_OR_DEPARTMENT_OTHER): Payer: Self-pay

## 2021-03-23 ENCOUNTER — Other Ambulatory Visit (HOSPITAL_BASED_OUTPATIENT_CLINIC_OR_DEPARTMENT_OTHER): Payer: Self-pay | Admitting: "Endocrinology

## 2021-03-23 ENCOUNTER — Other Ambulatory Visit (HOSPITAL_BASED_OUTPATIENT_CLINIC_OR_DEPARTMENT_OTHER): Payer: Self-pay | Admitting: NURSE PRACTITIONER

## 2021-03-23 ENCOUNTER — Encounter (HOSPITAL_BASED_OUTPATIENT_CLINIC_OR_DEPARTMENT_OTHER): Payer: Self-pay | Admitting: "Endocrinology

## 2021-03-23 DIAGNOSIS — E785 Hyperlipidemia, unspecified: Secondary | ICD-10-CM

## 2021-03-23 DIAGNOSIS — E139 Other specified diabetes mellitus without complications: Secondary | ICD-10-CM

## 2021-03-27 ENCOUNTER — Other Ambulatory Visit (HOSPITAL_BASED_OUTPATIENT_CLINIC_OR_DEPARTMENT_OTHER): Payer: Self-pay | Admitting: "Endocrinology

## 2021-03-27 DIAGNOSIS — E139 Other specified diabetes mellitus without complications: Secondary | ICD-10-CM

## 2021-03-27 MED ORDER — GABAPENTIN 300 MG CAPSULE
ORAL_CAPSULE | ORAL | 1 refills | Status: DC
Start: 2021-03-27 — End: 2021-03-27

## 2021-03-27 MED ORDER — GABAPENTIN 300 MG CAPSULE
600.0000 mg | ORAL_CAPSULE | Freq: Every evening | ORAL | 1 refills | Status: DC
Start: 2021-03-27 — End: 2021-07-26

## 2021-03-27 NOTE — Telephone Encounter (Signed)
Current Outpatient Medications   Medication Sig    aspirin (ECOTRIN) 81 mg Oral Tablet, Delayed Release (E.C.) Take 81 mg by mouth Once a day    atorvastatin (LIPITOR) 20 mg Oral Tablet TAKE 1 TABLET BY MOUTH EVERY DAY    Blood Sugar Diagnostic (ONETOUCH VERIO) Strip To check sugars 4 times daily as directed    cholecalciferol, Vitamin D3, 125 mcg (5,000 unit) Oral Tablet Take 5,000 Units by mouth Once a day    dulaglutide 4.5 mg/0.5 mL Subcutaneous Pen Injector Inject 0.5 mL (4.5 mg total) under the skin Every 7 days    empagliflozin (JARDIANCE) 25 mg Oral Tablet TAKE 1 TABLET BY MOUTH EVERY DAY    flash glucose scanning reader (FREESTYLE LIBRE 14 DAY READER) Does not apply Misc To test blood sugars E10.9    flash glucose sensor (FREESTYLE LIBRE 14 DAY SENSOR) Does not apply Kit To test blood sugars E10.9    gabapentin (NEURONTIN) 300 mg Oral Capsule TAKE 2 CAPSULES BY MOUTH IN THE EVENING    glucagon (GLUCAGEN) 1 mg/mL Injection Recon Soln 1 mg by Intramuscular route Once, as needed for Other (Blood sugar less than 70 and cannot eat) for up to 1 dose    insulin glargine (LANTUS SOLOSTAR U-100 INSULIN) 100 unit/mL Subcutaneous Insulin Pen Take 20 units nightly    insulin lispro (HUMALOG KWIKPEN INSULIN) 100 unit/mL Subcutaneous Insulin Pen Inject up to 40 units daily in 3 divided doses.    insulin pen needles (PEN NEEDLE) 32 gauge x 5/32" Needle To inject 4 times per day    lancets (ONE TOUCH DELICA) 33 gauge Misc To check sugars 4 times daily as directed    lisinopriL (PRINIVIL) 2.5 mg Oral Tablet TAKE 1 TABLET (2.5 MG TOTAL) BY MOUTH ONCE A DAY    metFORMIN (GLUCOPHAGE XR) 500 mg Oral Tablet Sustained Release 24 hr TAKE 4 TABLETS (2,000 MG TOTAL) BY MOUTH EVERY NIGHT    omeprazole (PRILOSEC) 40 mg Oral Capsule, Delayed Release(E.C.) Take 40 mg by mouth Once a day    TRULICITY 1.5 IB/7.0 mL Subcutaneous Pen Injector INJECT 0.5 ML (1.5 MG TOTAL) UNDER THE SKIN EVERY 7 DAYS FOR 14 DAYS    vits  A,C,E/zinc/copper (VISION-VITE PRESERVE ORAL) Take 1 Tab by mouth Twice daily     Last dept visit:  10/02/2020  Next pending dept visit: 10/05/2021  Refill request for gabapentin, pended to provider   Isabella Terrell 03/27/2021, 09:10

## 2021-03-30 ENCOUNTER — Other Ambulatory Visit (HOSPITAL_BASED_OUTPATIENT_CLINIC_OR_DEPARTMENT_OTHER): Payer: Self-pay | Admitting: "Endocrinology

## 2021-03-30 DIAGNOSIS — E139 Other specified diabetes mellitus without complications: Secondary | ICD-10-CM

## 2021-03-30 NOTE — Telephone Encounter (Signed)
On higher dose. Refused.Keturah Shavers  03/30/2021, 16:00

## 2021-04-04 ENCOUNTER — Ambulatory Visit (HOSPITAL_BASED_OUTPATIENT_CLINIC_OR_DEPARTMENT_OTHER): Payer: Self-pay | Admitting: "Endocrinology

## 2021-04-04 NOTE — Telephone Encounter (Signed)
Regarding: pharmacy needs to know which dose to take  ----- Message from Hezzie Bump sent at 04/04/2021  8:19 AM EST -----  Zadie Rhine, MD    Pharmacy needs to know which dose of the following patient is to be using.  Patient would like to have this called in this morning, due to today is the day she is take her medication.  Thank you!    Trulicity      Preferred Pharmacy      [id number] [address]    [address] [place] [address]    Phone: [phone] Fax: [phone]    Hours: Not open 24 hours

## 2021-04-13 ENCOUNTER — Other Ambulatory Visit (HOSPITAL_BASED_OUTPATIENT_CLINIC_OR_DEPARTMENT_OTHER): Payer: Self-pay | Admitting: INTERNAL MEDICINE-ENDOCRINOLOGY-DIABETES AND METABOLISM

## 2021-04-13 ENCOUNTER — Other Ambulatory Visit (HOSPITAL_BASED_OUTPATIENT_CLINIC_OR_DEPARTMENT_OTHER): Payer: Self-pay | Admitting: "Endocrinology

## 2021-04-13 DIAGNOSIS — E139 Other specified diabetes mellitus without complications: Secondary | ICD-10-CM

## 2021-04-13 NOTE — Telephone Encounter (Signed)
Gabapentin sent to pharmacy two weeks ago. Refused.Isabella Terrell  04/13/2021, 14:55

## 2021-04-13 NOTE — Telephone Encounter (Signed)
Higher dose was sent in November with enough refills for one year. Patient was sent message on 04/09/21 by nursing staff asking for patient to clarify dose. Refused.Keturah Shavers  04/13/2021, 14:54

## 2021-04-18 ENCOUNTER — Ambulatory Visit (HOSPITAL_BASED_OUTPATIENT_CLINIC_OR_DEPARTMENT_OTHER): Payer: Self-pay | Admitting: "Endocrinology

## 2021-04-18 NOTE — Telephone Encounter (Signed)
[name], [name], MD  Donnie Mesa, RN  Caller: Unspecified (Today, 11:43 AM)  If she is tolerating Trulicity well and able to get it and insurance is covering it, did she give a reason for wanting to switch to Ozempic?     Desiree Hane, RN  04/18/2021, 14:26

## 2021-04-18 NOTE — Telephone Encounter (Signed)
Regarding: rx  ----- Message from [name] sent at 04/18/2021 11:46 AM EST -----  Zadie Rhine, MD    Pt is requesting to be switched to ozempic. She states the pharmacy she goes to has the 1.0 dose in stock. She states if the provider wants her on a higher dose then she wants to at least do the 1.0 dose for this month. Thank you!        [id number] [address]   Phone:  [phone]  Fax:  [phone]

## 2021-04-18 NOTE — Telephone Encounter (Signed)
Routing to provider to advise on switch from Trulicity to [employer].      Bethanie Dicker, RN  04/18/2021, 12:01

## 2021-04-19 ENCOUNTER — Other Ambulatory Visit (HOSPITAL_BASED_OUTPATIENT_CLINIC_OR_DEPARTMENT_OTHER): Payer: Self-pay | Admitting: "Endocrinology

## 2021-04-19 DIAGNOSIS — E139 Other specified diabetes mellitus without complications: Secondary | ICD-10-CM

## 2021-04-19 MED ORDER — OZEMPIC 1 MG/DOSE (4 MG/3 ML) SUBCUTANEOUS PEN INJECTOR
1.0000 mg | PEN_INJECTOR | SUBCUTANEOUS | 0 refills | Status: DC
Start: 2021-04-19 — End: 2021-10-05

## 2021-04-19 MED ORDER — OZEMPIC 2 MG/DOSE (8 MG/3 ML) SUBCUTANEOUS PEN INJECTOR
2.0000 mg | PEN_INJECTOR | SUBCUTANEOUS | 3 refills | Status: DC
Start: 2021-04-19 — End: 2021-11-21

## 2021-04-24 ENCOUNTER — Other Ambulatory Visit (HOSPITAL_BASED_OUTPATIENT_CLINIC_OR_DEPARTMENT_OTHER): Payer: Self-pay | Admitting: "Endocrinology

## 2021-04-24 DIAGNOSIS — E139 Other specified diabetes mellitus without complications: Secondary | ICD-10-CM

## 2021-04-24 NOTE — Telephone Encounter (Signed)
Pending metformin to different pharmacy per patient request.Valdez Brannan Andrey Campanile, RN  04/24/2021, 12:15

## 2021-05-16 ENCOUNTER — Other Ambulatory Visit (HOSPITAL_BASED_OUTPATIENT_CLINIC_OR_DEPARTMENT_OTHER): Payer: Self-pay | Admitting: INTERNAL MEDICINE-ENDOCRINOLOGY-DIABETES AND METABOLISM

## 2021-05-16 DIAGNOSIS — E139 Other specified diabetes mellitus without complications: Secondary | ICD-10-CM

## 2021-05-17 NOTE — Telephone Encounter (Signed)
Dispense Date Outside Name Pharmacy Quantity Refills remaining Days supply Sig Source                    04/21/2021 GABAPENTIN 300MG  [id number] [place], [place] [phone] 180.00 Capsule  90  Surescripts (Claim History, Ambulatory)     Unit Strength: 300  Authorized by: Keith Rake        Patient picked up a 49 day supply from pharmacy in [place] on 04/21/21. Should not need another script sent to pharmacy in [facility] as they have requested. Message sent to patient. Loyce Dys  05/17/2021, 11:15

## 2021-05-17 NOTE — Telephone Encounter (Signed)
Was just dispensed on 04/21/21 for a 90 day supply per dispensing record. Refused.Loyce Dys  05/17/2021, 11:12

## 2021-07-26 ENCOUNTER — Other Ambulatory Visit (HOSPITAL_BASED_OUTPATIENT_CLINIC_OR_DEPARTMENT_OTHER): Payer: Self-pay | Admitting: "Endocrinology

## 2021-07-26 DIAGNOSIS — E139 Other specified diabetes mellitus without complications: Secondary | ICD-10-CM

## 2021-07-26 NOTE — Telephone Encounter (Signed)
Regarding: send to [facility] pharmacy  ----- Message from Sherre Lain sent at 07/26/2021  3:20 PM EDT -----  Dr. Drusilla Kanner    Please send an new rx for gabapentin to [place] [facility].  [place] [facility] will not transfer    gabapentin (NEURONTIN) 300 mg Oral Capsule 180 Capsule 1 03/27/2021    Sig - Route: Take 2 Capsules (600 mg total) by mouth Every night - Oral   Sent to pharmacy as: gabapentin 300 mg capsule (NEURONTIN)   Class: E-Rx   Non-formulary Exception Code: [email or web address]/No Formulary Info Available   E-Prescribing Status: Receipt confirmed by pharmacy (03/27/2021 9:47 AM EST)     Preferred Pharmacy        [id number] [place], [place] - [address]    [address] [place] [address]    Phone: [phone] Fax: [phone]    Hours: Not open 24 hours

## 2021-07-26 NOTE — Telephone Encounter (Signed)
Pending.  Donnie Mesa, RN  07/26/2021, 15:45

## 2021-07-27 MED ORDER — GABAPENTIN 300 MG CAPSULE
600.0000 mg | ORAL_CAPSULE | Freq: Every evening | ORAL | 1 refills | Status: DC
Start: 2021-07-27 — End: 2022-02-21

## 2021-10-04 NOTE — Progress Notes (Addendum)
[place] [facility]  RETURN VISIT    Chief Complaint:  LADA treated as type II DM  Referring Provider: No Pcp  PCP: No Pcp     HPI:   Isabella Terrell is a 66 y.o. 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     Past history:  -Diagnosed with type II diabetes mellitus about 25 years ago after presenting to her PCP with polyuria  -Has never been hospitalized for high or low sugars. Has never been in DKA  -Was having multiple UTI and yeast infections so her gynecologist checked her HgA1C on 11/21/16 which was elevated to 12.6%. No longer having frequent UTIs  -When established here, GAD and islet cell antibodies were checked to evaluate for type I DM with GAD antibodies coming back positive likely indicating LADA however patient also has insulin resistance in addition to detectable C-peptide 2.0 in 08/2019 so currently treating as type II DM    Had [place]. Kept falling off. Unsteady on feet sometimes. Had ruptured achilles tendon which didn't heal fully. Walking okay. Interested in [place].     -Most recent HgA1C:  Lab Results   Component Value Date    HA1C 7.0 (H) 10/02/2020     CURRENT ANTI-DIABETIC REGIMEN:  Patient is adherent to meds   Lantus 22 units qPM   Ozempic 2 mg weekly   Jardiance 25 mg daily   Metformin-XR 1000 mg BID     PREVIOUS ANTI-DIABETIC MEDS:   Januvia- D/Ced to switch to insulin   Metformin (IR)- D/Ced to switch to insulin   Wegovy- Ran out and could not get   Humalog- Not taking as sugars were improving and due to weight gain (notes gained 20 lbs while on it)     BLOOD GLUCOSE TRENDS: Testing BG 0 times per day  Time Readings   Before Breakfast N/A   Before Lunch N/A   Before Supper N/A   Bedtime  N/A       HYPOGLYCEMIA EVALUATION:  Is it occurring? Symptoms? Thinks had 2-3 lows recently; symptoms include feeling weak, queasy   Hypoglycemic Event: (Date/Time) 2-3 lows  recently   Treatment: Food, juice   Assisted or Unassisted: Unassisted   Hypoglycemic unawareness? No; feels weird when sugars in 70s   -Glucagon kit: Yes    Insurance stopped covering Contrave.     Wt Readings from Last 20 Encounters:   10/05/21 70 kg (154 lb 5.2 oz)   10/02/20 71.4 kg (157 lb 6.5 oz)   03/23/20 73.6 kg (162 lb 4.1 oz)   09/09/19 78 kg (171 lb 15.3 oz)   03/04/19 77.6 kg (171 lb 1.2 oz)   09/08/18 79.7 kg (175 lb 11.3 oz)   03/03/18 80.1 kg (176 lb 9.4 oz)   11/11/17 76.6 kg (168 lb 14 oz)   07/25/17 74.6 kg (164 lb 7.4 oz)   07/15/17 74.9 kg (165 lb 2 oz)   03/11/17 74.7 kg (164 lb 10.9 oz)   01/16/17 69.9 kg (154 lb)   01/16/17 70.3 kg (154 lb 15.7 oz)   12/17/16 71.1 kg (156 lb 12 oz)   10/12/12 67.5 kg (148 lb 13 oz)   09/23/12 68.9 kg (152 lb)       DIABETIC COMPLICATIONS:  Microvascular Complications:    Peripheral/Autonomic Neuropathy: +Mild peripheral neuropathy. On Neurontin 600 qPM   Retinopathy: Last eye exam 07/2021. Has diabetic  retinopathy requiring eye injections and s/p laser therapy once   Renal: Last GFR:   Lab Results   Component Value Date    GFR >90 10/02/2020      Microalb/crt ratio:  Urine Microalb/cr ratio   Lab Results   Component Value Date/Time    MICALBCRERAT 17.6 10/02/2020 11:39 AM         On ACEI/ARB: Lisinopril 2.5 mg daily    Macrovascular Complications:   Hx of CAD: No   Hx of statin/ASA: On Lipitor, no aspirin; SGLT2 or GLP-1 therapy? No SGLT-2; currently on Ozempic and Jardiance   Hx of Stroke: No   Hx of HTN: Yes   Hx of HLD: Yes    Wt Readings from Last 15 Encounters:   10/05/21 70 kg (154 lb 5.2 oz)   10/02/20 71.4 kg (157 lb 6.5 oz)   03/23/20 73.6 kg (162 lb 4.1 oz)   09/09/19 78 kg (171 lb 15.3 oz)   03/04/19 77.6 kg (171 lb 1.2 oz)   09/08/18 79.7 kg (175 lb 11.3 oz)   03/03/18 80.1 kg (176 lb 9.4 oz)   11/11/17 76.6 kg (168 lb 14 oz)   07/25/17 74.6 kg (164 lb 7.4 oz)   07/15/17 74.9 kg (165 lb 2 oz)   03/11/17 74.7 kg (164 lb 10.9 oz)   01/16/17  69.9 kg (154 lb)   01/16/17 70.3 kg (154 lb 15.7 oz)   12/17/16 71.1 kg (156 lb 12 oz)   10/12/12 67.5 kg (148 lb 13 oz)       REVIEW OF SYSTEMS:  ROS: All systems reviewed and negative except for as above.    Past Medical History:   Diagnosis Date   . Diabetic retinopathy (CMS [place])    . HLD (hyperlipidemia)    . HTN (hypertension)    . LADA (latent autoimmune diabetes in adults), managed as type 1 (CMS [place])      Past Surgical History:   Procedure Laterality Date   . ENDOMETRIAL ABLATION     . HX TONSILLECTOMY     . HX WISDOM TEETH EXTRACTION     . TRIGGER FINGER RELEASE       Social History     Tobacco Use   . Smoking status: Never   . Smokeless tobacco: Never   Substance Use Topics   . Alcohol use: No   . Drug use: No        Family Medical History:     Problem Relation (Age of Onset)    Coronary Artery Disease Mother    Diabetes Father    Diabetes type II Father, Brother, Paternal Uncle, Sister    Hypothyroidism Mother, Sister    Multiple Sclerosis Brother    Other Mother, Father        Current Outpatient Medications   Medication Sig   . aspirin (ECOTRIN) 81 mg Oral Tablet, Delayed Release (E.C.) Take 1 Tablet (81 mg total) by mouth Once a day   . atorvastatin (LIPITOR) 20 mg Oral Tablet TAKE 1 TABLET BY MOUTH EVERY DAY   . cholecalciferol, Vitamin D3, 125 mcg (5,000 unit) Oral Tablet Take 2 Tablets (10,000 Units total) by mouth Once a day   . empagliflozin (JARDIANCE) 25 mg Oral Tablet TAKE 1 TABLET BY MOUTH EVERY DAY   . gabapentin (NEURONTIN) 300 mg Oral Capsule Take 2 Capsules (600 mg total) by mouth Every night   . glucagon (GLUCAGEN) 1 mg/mL Injection Recon Soln 1 mg by Intramuscular route Once,  as needed for Other (Blood sugar less than 70 and cannot eat) for up to 1 dose   . insulin glargine (LANTUS SOLOSTAR U-100 INSULIN) 100 unit/mL Subcutaneous Insulin Pen Take 20 units nightly   . insulin pen needles (PEN NEEDLE) 32 gauge x 5/32" Needle To inject 4 times per day   . lisinopriL (PRINIVIL) 2.5 mg Oral  Tablet TAKE 1 TABLET (2.5 MG TOTAL) BY MOUTH ONCE A DAY   . metFORMIN (GLUCOPHAGE XR) 500 mg Oral Tablet Sustained Release 24 hr TAKE 4 TABLETS (2,000 MG TOTAL) BY MOUTH EVERY NIGHT   . semaglutide (OZEMPIC) 2 mg/dose (8 mg/3 mL) Subcutaneous Pen Injector Inject 2 mg under the skin Every 7 days     No Known Allergies    PHYSICAL EXAM:   Vitals:    10/05/21 1309   BP: 128/60   Pulse: 92   SpO2: 97%   Weight: 70 kg (154 lb 5.2 oz)   Height: 1.549 m (_0 )   BMI: 29.22      Physical Exam:  General: Well appearing female in no acute distress.  Psychiatric: Normal mood and affect  Neuro: Alert and oriented x 3. Speech fluent. Cranial nerves II-XII intact. No focal deficits noted.  HEENT: Head normocephalic, atraumatic. EOMI, conjunctiva clear. Oral mucosa pink and moist.   Thyroid/Neck: Trachea midline  Lungs: Normal respiratory effort.   Cardiac: Regular rate and rhythm  Abdomen: Non-distended  Extremities: No edema noted bilaterally  Skin: No visible rashes.  Diabetic foot exam:  No edema bilaterally. No ulcerations or lesions bilaterally. Sensation normal bilaterally.         LABS:  Lab Results   Component Value Date/Time    HA1C 7.0 (H) 10/02/2020 11:39 AM    SODIUM 140 10/02/2020 11:39 AM    POTASSIUM 4.3 10/02/2020 11:39 AM    CHLORIDE 103 10/02/2020 11:39 AM    CO2 29 10/02/2020 11:39 AM    BUN 15 10/02/2020 11:39 AM    CREATININE 0.63 10/02/2020 11:39 AM    MICALBCRERAT 17.6 10/02/2020 11:39 AM    AST 31 08/28/2018 09:10 AM    ALT 41 08/28/2018 09:10 AM     12/24/2016 1:28 PM - Edi, Reference Lab Results In      Component Results     Component Value Ref Range & Units Status    GAD65 AB ASSAY 0.12 (H) <=0.02 nmol/L Final     12/24/2016 1:30 PM - Edi, Reference Lab Results In      Component Results     Component Value Ref Range & Units Status    ISLET ANTIGEN 2 (IA-2) ANTIBODY, SERUM 0.00 <=0.02 nmol/L Final        Ref. Range 09/09/2019 09:05   TSH Latest Ref Range: 0.430 - 3.550 uIU/mL 1.853   C-PEPTIDE  Latest Ref Range: 0.9 - 7.1 ng/mL 2.0     ASSESSMENT:  Isabella Terrell is a 66 y.o. 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      PLAN:  LADA treated as Type II DM with Insulin Resistance complicated by Occasional Hyperglycemia and Hypogylcemia  -Complicated by occasional hyperglycemia and hypoglycemia, mild peripheral neuropathy, past microalbuminuria (now normal as of 2018), diabetic retinopathy and macrovascular diseases including HTN, HLD  -Anti-GAD and anti-islet cell antibodies to evaluate for type 1 DM checked and positive for GAD antibody however patient also has insulin resistance and requires  higher doses of insulin which has contributed to weight gain  -C-peptide detectable at 2.0 in 08/2019   -Most recent HgA1C 7% in 09/2020; will repeat today. Urine microalbumin/creatinine ratio 17.6 in 09/2020, will repeat. Currently on lisinopril 2.5 mg daily  -Annual diabetes foot exam done today, 10/05/2021  -Most recent diabetic eye exam 07/2021. Has diabetic retinopathy requiring eye injections and s/p laser therapy  -Hypoglycemia occasionally occuring. Has glucagon injection  -Reviewed blood sugars. Not checking much recently. Stopped Humalog due to improved sugars and weight gain  -Continue metformin-XR 1000 mg BID and Jardiance 25 mg daily  -Continue Ozempic 2 mg weekly for now. If A1C elevated, will try switching to [facility] first then if still elevated, discussed that we may need to restart a short-acting insulin with 1-2 meals a day to help get sugars down to help prevent risks associated with uncontrolled sugars like stroke and heart disease. -Will start on combination GLP-1/GIP agonist, Mounjaro, to help with glucose control and cardioprotection. Counseled patient on most common side effect being nausea but that this normally subsides after a few weeks. Also discussed rare side effect of pancreatitis  and instructed patient if she feels severe abdominal pain, nausea, vomiting, to call the clinic or go to the Emergency Department. Discussed black box warning of medullary thyroid cancer in rats but not in humans. Patient has no personal or family history of thyroid cancer or personal history of pancreatitis.  -Continue Lantus 22 units qPM  -Has glucometer, test strips, lancets, pen needles. Discussed with diabetes education nurse, Tonia Ghent, who provided Dexcom instruction  -Most recent BMP 09/2020 with creatinine 0.63 and GFR >90, will repeat    Peripheral Neuropathy  -Continue Neurontin 600 mg qPM     Hgba1c Goal <7%   Labs today: HgA1C, BMP, urine microalbumin/Cr ratio, TSH   Yearly eye and daily foot exams discussed   Symptoms of potential hypoglycemia discussed and treatment of hypoglycemia discussed   Return visit in 6 months or earlier if needed      Right Thyroid Nodule  -Thyroid ultrasound done 03/06/17 without thyroid nodules with homogenous thyroid texture  -Repeat ultrasound 08/2019 showed only a small 0.4 cm right isoechoic nodule, will repeat ultrasound  -Will repeat TSH to evaluate thyroid function    Orders Placed This Encounter   . HGA1C (HEMOGLOBIN A1C WITH EST AVG GLUCOSE)   . BASIC METABOLIC PANEL   . MICROALBUMIN/CREATININE RATIO, URINE, RANDOM   . THYROID STIMULATING HORMONE (SENSITIVE TSH)     Keith Rake, MD, MPH 10/05/2021, 14:27

## 2021-10-05 ENCOUNTER — Other Ambulatory Visit (HOSPITAL_BASED_OUTPATIENT_CLINIC_OR_DEPARTMENT_OTHER): Payer: Medicare Other

## 2021-10-05 ENCOUNTER — Other Ambulatory Visit: Payer: Self-pay

## 2021-10-05 ENCOUNTER — Encounter (HOSPITAL_BASED_OUTPATIENT_CLINIC_OR_DEPARTMENT_OTHER): Payer: Self-pay | Admitting: "Endocrinology

## 2021-10-05 ENCOUNTER — Ambulatory Visit: Payer: Medicare Other | Attending: "Endocrinology | Admitting: "Endocrinology

## 2021-10-05 VITALS — BP 128/60 | HR 92 | Ht 61.0 in | Wt 154.3 lb

## 2021-10-05 DIAGNOSIS — E11649 Type 2 diabetes mellitus with hypoglycemia without coma: Secondary | ICD-10-CM

## 2021-10-05 DIAGNOSIS — E1142 Type 2 diabetes mellitus with diabetic polyneuropathy: Secondary | ICD-10-CM

## 2021-10-05 DIAGNOSIS — E1165 Type 2 diabetes mellitus with hyperglycemia: Secondary | ICD-10-CM

## 2021-10-05 DIAGNOSIS — E139 Other specified diabetes mellitus without complications: Secondary | ICD-10-CM | POA: Insufficient documentation

## 2021-10-05 DIAGNOSIS — Z7984 Long term (current) use of oral hypoglycemic drugs: Secondary | ICD-10-CM

## 2021-10-05 DIAGNOSIS — E041 Nontoxic single thyroid nodule: Secondary | ICD-10-CM

## 2021-10-05 DIAGNOSIS — E11319 Type 2 diabetes mellitus with unspecified diabetic retinopathy without macular edema: Secondary | ICD-10-CM

## 2021-10-05 DIAGNOSIS — E785 Hyperlipidemia, unspecified: Secondary | ICD-10-CM

## 2021-10-05 DIAGNOSIS — I10 Essential (primary) hypertension: Secondary | ICD-10-CM

## 2021-10-05 DIAGNOSIS — Z794 Long term (current) use of insulin: Secondary | ICD-10-CM

## 2021-10-05 LAB — POC BLOOD GLUCOSE (RESULTS): GLUCOSE, POC: 103 mg/dl (ref 70–105)

## 2021-10-05 LAB — BASIC METABOLIC PANEL
ANION GAP: 11 mmol/L (ref 4–13)
BUN/CREA RATIO: 17 (ref 6–22)
BUN: 11 mg/dL (ref 8–25)
CALCIUM: 9.8 mg/dL (ref 8.8–10.2)
CHLORIDE: 105 mmol/L (ref 96–111)
CO2 TOTAL: 27 mmol/L (ref 23–31)
CREATININE: 0.63 mg/dL (ref 0.60–1.05)
ESTIMATED GFR: >90 mL/min/BSA (ref >=60)
GLUCOSE: 114 mg/dL (ref 65–125)
POTASSIUM: 4.1 mmol/L (ref 3.5–5.1)
SODIUM: 143 mmol/L (ref 136–145)

## 2021-10-05 LAB — MICROALBUMIN/CREATININE RATIO, URINE, RANDOM
CREATININE RANDOM URINE: 103 mg/dL — ABNORMAL HIGH (ref 50–100)
MICROALBUMIN RANDOM URINE: 1.6 mg/dL
MICROALBUMIN/CREATININE RATIO RANDOM URINE: 15.5 mg/g (ref <30.0)

## 2021-10-05 LAB — THYROID STIMULATING HORMONE (SENSITIVE TSH): TSH: 1.26 u[IU]/mL (ref 0.430–3.550)

## 2021-10-05 NOTE — Addendum Note (Signed)
Addended by: Amalia Hailey on: 10/05/2021 02:39 PM     Modules accepted: Orders

## 2021-10-05 NOTE — Nursing Note (Signed)
Educated patient on use of Dexcom G7. Set up all on phone, connected with clinic. Reviewed skip tips. Patient states she feels that her blood sugar is low. Placed order for POCT FS, blood sugar reading was 103. Patient states that she ate 2 hours ago but did not check blood sugar since she was not wearing a sensor. Provided patient with juice. She is getting blood drawn and wants to wait to eat crackers until blood is drawn. Sent crackers and juice home with patient. Reviewed treatment of lows, educated on 15/15 method. Patient does not normally carry anything with her for treatment of lows. Reviewed glucose tablets and other options to keep on hand in the event of hypoglycemia.Amalia Hailey, RN

## 2021-10-06 LAB — HGA1C (HEMOGLOBIN A1C WITH EST AVG GLUCOSE)
ESTIMATED AVERAGE GLUCOSE: 163 mg/dL
HEMOGLOBIN A1C: 7.3 % — ABNORMAL HIGH (ref 4.0–5.6)

## 2021-10-07 MED ORDER — DEXCOM G7 SENSOR DEVICE
3 refills | Status: AC
Start: 2021-10-07 — End: ?

## 2021-10-07 NOTE — Addendum Note (Signed)
Addended by: Gordy Levan on: 10/07/2021 08:24 AM     Modules accepted: Orders

## 2021-10-24 ENCOUNTER — Telehealth (HOSPITAL_BASED_OUTPATIENT_CLINIC_OR_DEPARTMENT_OTHER): Payer: Self-pay | Admitting: "Endocrinology

## 2021-10-24 NOTE — Telephone Encounter (Signed)
Received Eye Exam Report done on 10/23/2021. Scanned & Routing to Provider @ this time. Neita Goodnight, [place]  10/24/2021, 12:01

## 2021-11-15 ENCOUNTER — Other Ambulatory Visit (HOSPITAL_BASED_OUTPATIENT_CLINIC_OR_DEPARTMENT_OTHER): Payer: Self-pay | Admitting: "Endocrinology

## 2021-11-15 DIAGNOSIS — E139 Other specified diabetes mellitus without complications: Secondary | ICD-10-CM

## 2021-11-21 ENCOUNTER — Other Ambulatory Visit (HOSPITAL_BASED_OUTPATIENT_CLINIC_OR_DEPARTMENT_OTHER): Payer: Self-pay | Admitting: "Endocrinology

## 2021-11-21 DIAGNOSIS — E139 Other specified diabetes mellitus without complications: Secondary | ICD-10-CM

## 2021-11-29 ENCOUNTER — Telehealth (HOSPITAL_BASED_OUTPATIENT_CLINIC_OR_DEPARTMENT_OTHER): Payer: Self-pay | Admitting: "Endocrinology

## 2021-11-29 NOTE — Telephone Encounter (Signed)
Received Eye Exam Report done on 11/27/2021. Scanned & Routing to Provider @ this time. Isabella Terrell, [place]  11/29/2021, 13:40

## 2022-02-18 ENCOUNTER — Other Ambulatory Visit (HOSPITAL_BASED_OUTPATIENT_CLINIC_OR_DEPARTMENT_OTHER): Payer: Self-pay | Admitting: "Endocrinology

## 2022-02-18 DIAGNOSIS — E139 Other specified diabetes mellitus without complications: Secondary | ICD-10-CM

## 2022-02-21 NOTE — Telephone Encounter (Signed)
Summary: pt low on rx    Doctor Name:  Telford Nab, MD    Date of last appointment: 10/05/21    Next scheduled visit: 03/28/22    Medication Requested:    gabapentin (NEURONTIN) 300 mg Oral Capsule    Preferred Pharmacy     [id number] [place] [place] [address] [place]  [address]   Phone: [phone] Fax: [phone]   Hours: Not open 24 hours     Notes for nurses or provider: Pt will be out on Monday               Pending to the provider       Alinda Deem, RN  02/21/2022, 13:41

## 2022-02-22 NOTE — Telephone Encounter (Signed)
Summary: ozempic    Dr. Drusilla Kanner    The patient states there is a nationwide shortage of Ozempic 2 units, and she can't get the medicine. She's asking to speak with you to see what else you could recommend?  She states she was on Trulicity and [id number] before  please call to discuss this issue.   Advised pt that our clinic asks for up to 48-72 business hours to complete requests.      Preferred Pharmacy      [id number] [place] [address]   [address] [place] [address]   Phone: [phone] Fax: [phone]   Hours: Not open 24 hours              Sending MyChart message         Wonda Amis, RN  02/22/2022, 14:13

## 2022-02-25 ENCOUNTER — Ambulatory Visit (HOSPITAL_BASED_OUTPATIENT_CLINIC_OR_DEPARTMENT_OTHER): Payer: Self-pay | Admitting: "Endocrinology

## 2022-02-25 NOTE — Telephone Encounter (Signed)
Regarding: ozempic issues  ----- Message from Overton Mam sent at 02/25/2022 10:55 AM EST -----  Dr. Drusilla Kanner    Pt calling to follow up on this request.  Please advise. Thank you!    ----- Message from Sander Nephew sent at 02/22/2022  1:02 PM EST -----  Dr. Drusilla Kanner    The patient states there is a nationwide shortage of Ozempic 2 units, and she can't get the medicine. She's asking to speak with you to see what else you could recommend?  She states she was on Trulicity and [id number] before  please call to discuss this issue.   Advised pt that our clinic asks for up to 48-72 business hours to complete requests.      Preferred Pharmacy      [id number] [address]    [address] [place] [address]    Phone: [phone] Fax: [phone]    Hours: Not open 24 hours

## 2022-03-28 ENCOUNTER — Ambulatory Visit (HOSPITAL_BASED_OUTPATIENT_CLINIC_OR_DEPARTMENT_OTHER): Payer: Self-pay | Admitting: "Endocrinology

## 2022-03-29 IMAGING — MG MAMMOGRAPHY DIAGNOSTIC BILATERAL 3[PERSON_NAME]
8 series · 8 of 24 positions shown · non-contrast
Comparison: Comparison was made to prior examinations.

________________________________________________________________________________________________ 
MAMMOGRAPHY DIAGNOSTIC BILATERAL 3ASCANI GOVERNALI, 03/29/2022 [DATE]: 
CLINICAL INDICATION: Questionable lump left breast.
TECHNIQUE: Digital bilateral mammograms and 3-D Tomosynthesis were obtained. 
These were interpreted both primarily and with the aid of computer-aided 
detection system.  
BREAST DENSITY: (Level B) There are scattered areas of fibroglandular density.

[R CC]
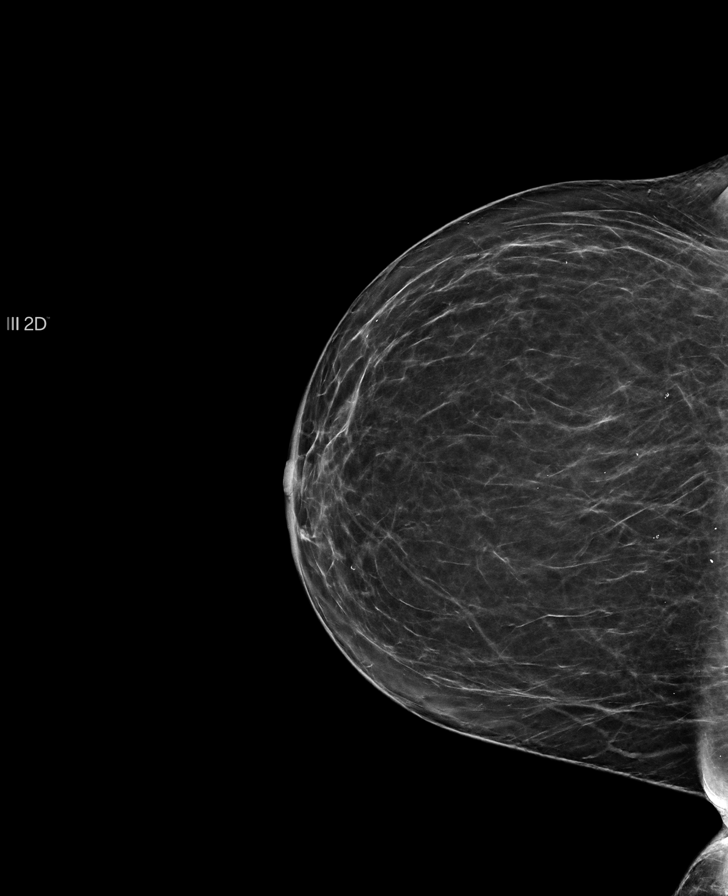

[L CC]
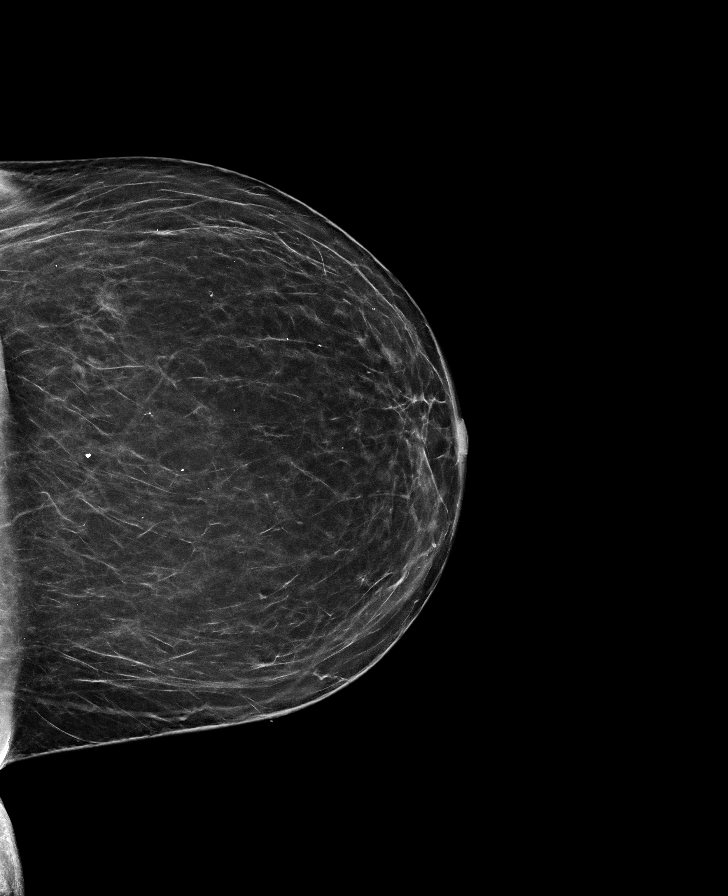

[R MLO]
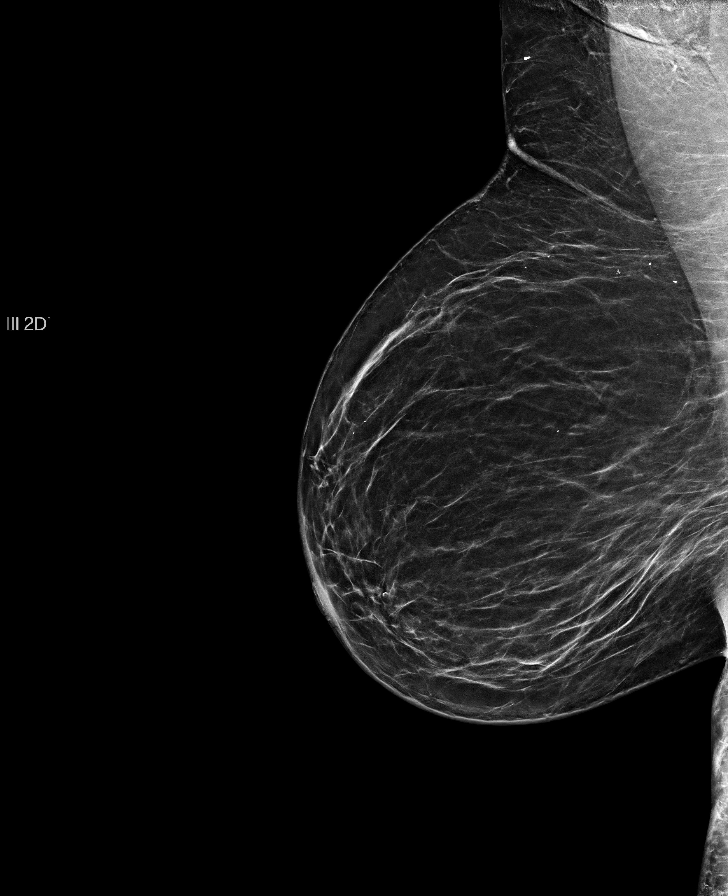

[L MLO]
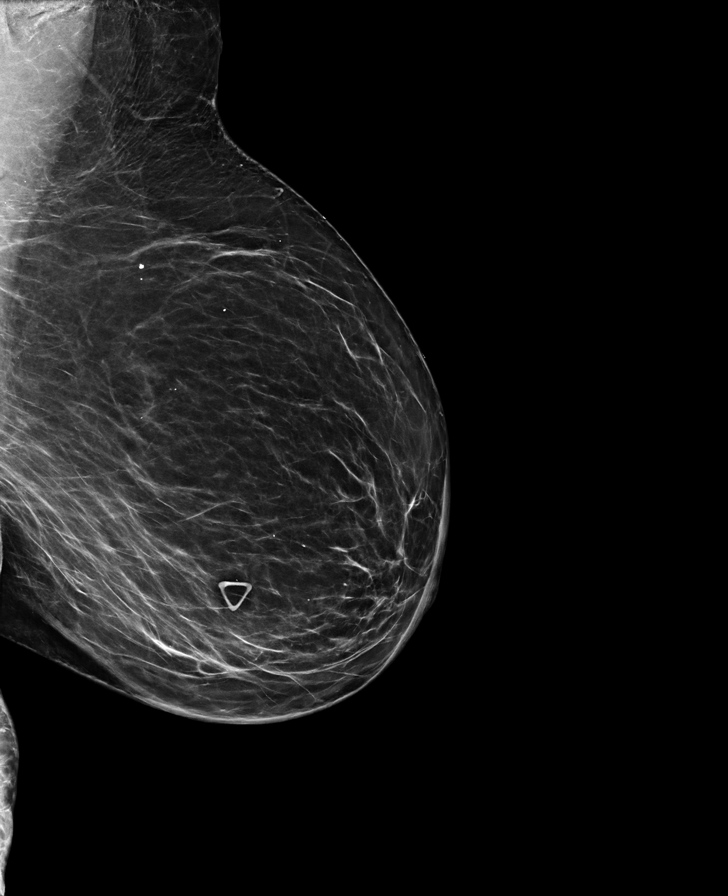

[R MLO tomo · tomo slice 35/69.0]
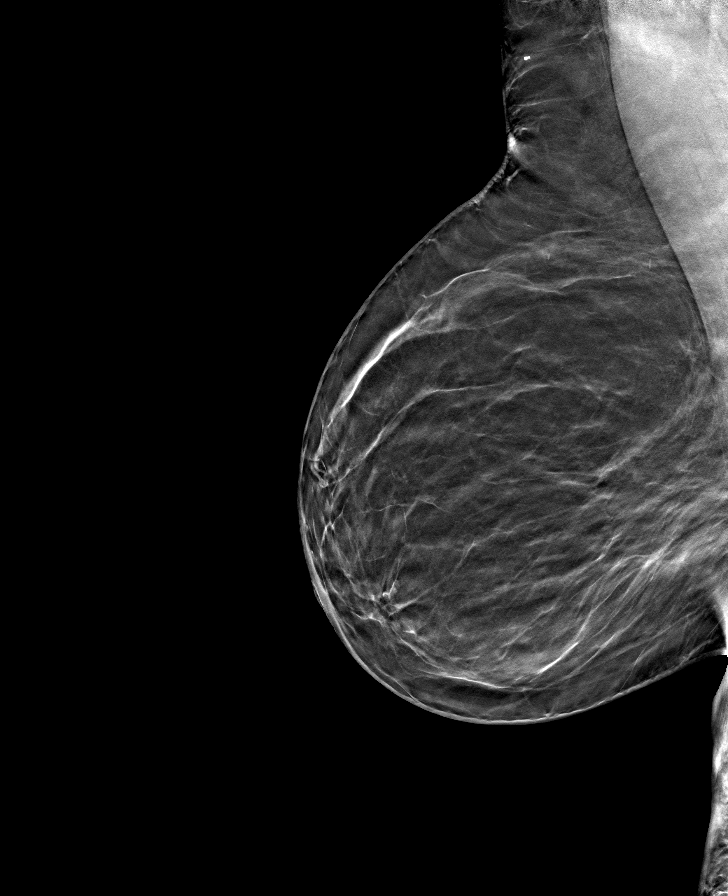

[L CC tomo · tomo slice 32/63.0]
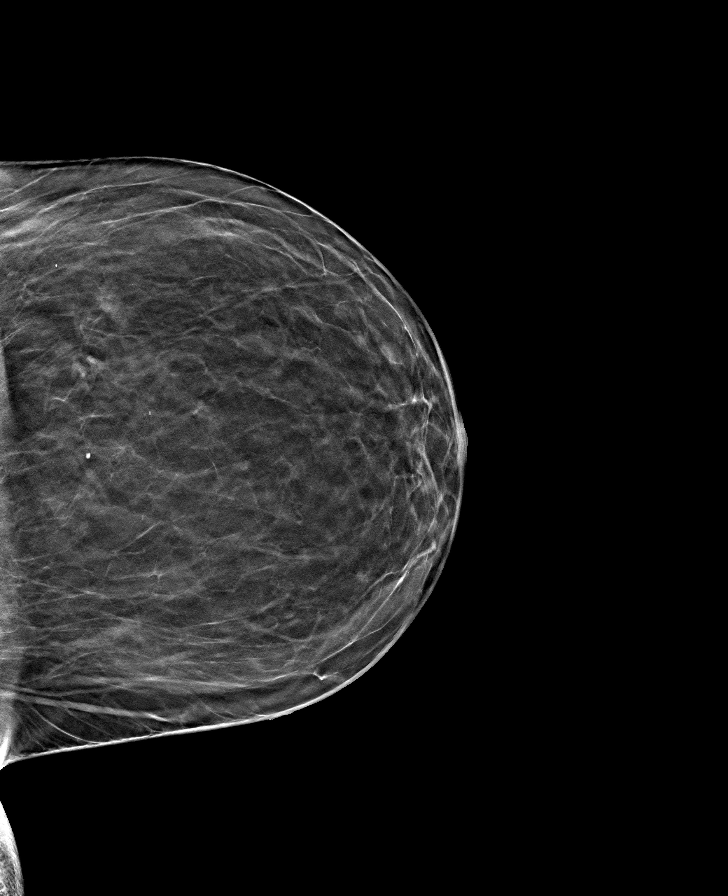

[R CC tomo · tomo slice 31/60.0]
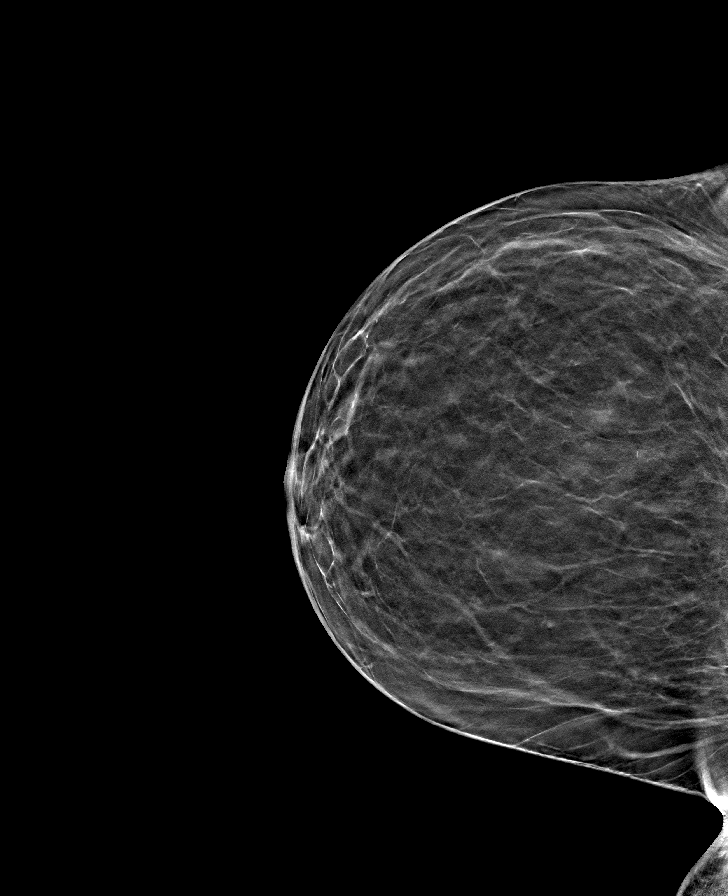

[L MLO tomo · tomo slice 36/71.0]
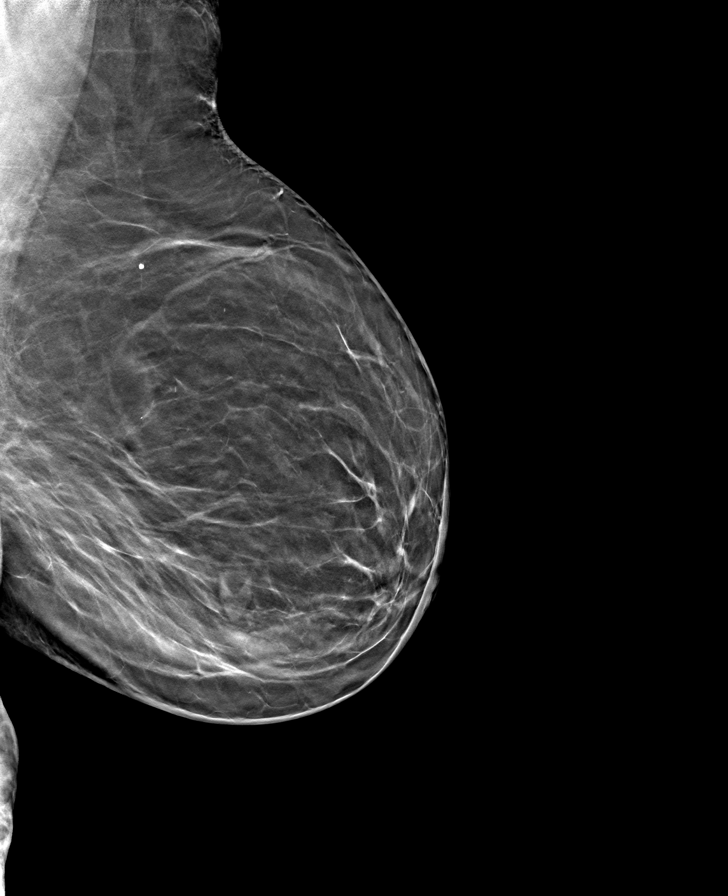

[8 of 24 positions shown; findings below may reference images not displayed]

FINDINGS: Benign calcification. An area of the marker in the left breast is no 
evidence of dominant mass. No suspicious calcification. Ultrasound evaluation of 
the area in the left breast was unremarkable.
IMPRESSION: Stable mammogram with unremarkable sonogram of the questionable palpable area in 
the left breast. 
(BI-RADS 2) Benign findings. Routine mammographic follow-up is recommended.

## 2022-04-17 ENCOUNTER — Telehealth (HOSPITAL_BASED_OUTPATIENT_CLINIC_OR_DEPARTMENT_OTHER): Payer: Self-pay | Admitting: "Endocrinology

## 2022-04-17 NOTE — Telephone Encounter (Signed)
Received Eye Exam done on 04/12/2022. Scanned & Routing to Provider @ this time. Merleen Milliner, [place]  04/17/2022, 11:14

## 2022-05-04 IMAGING — MR MRI LEFT ANKLE WITHOUT CONTRAST
3 of 5 series · 9 of 40 positions shown · IV contrast (gadolinium)
Comparison: None.

________________________________________________________________________________________________ 
MRI LEFT ANKLE WITHOUT CONTRAST, 05/04/2022 [DATE]: 
CLINICAL INDICATION: Achilles tendinitis, left leg. Patient reports history of 
Achilles tendon rupture treated nonsurgically.
TECHNIQUE: Multiplanar, multiecho position MR images of the ankle were performed 
without intravenous gadolinium enhancement. Patient was scanned on a
magnet.

[Series 201: survey_left · axial · 10.0mm · 0.65mm/px · z∈[-20,+125]mm · 3 of 9 slices shown]
[im 1/9]
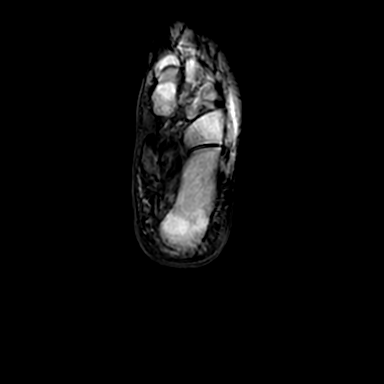
[im 5/9]
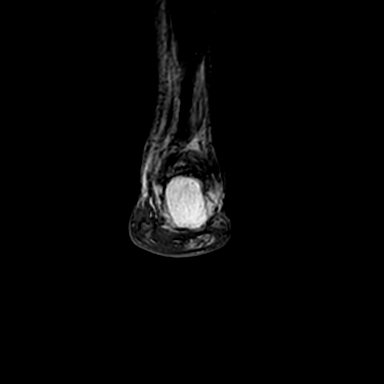
[im 9/9]
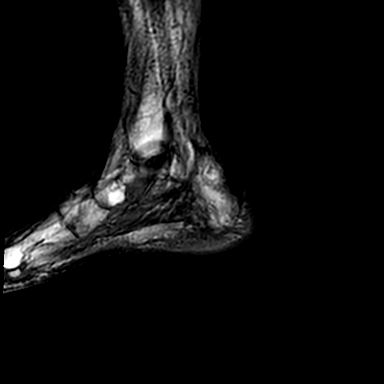

[Series 301: t1_sag · sagittal · 2.5mm · 0.15mm/px · 3 of 25 slices shown]
[im 5/25]
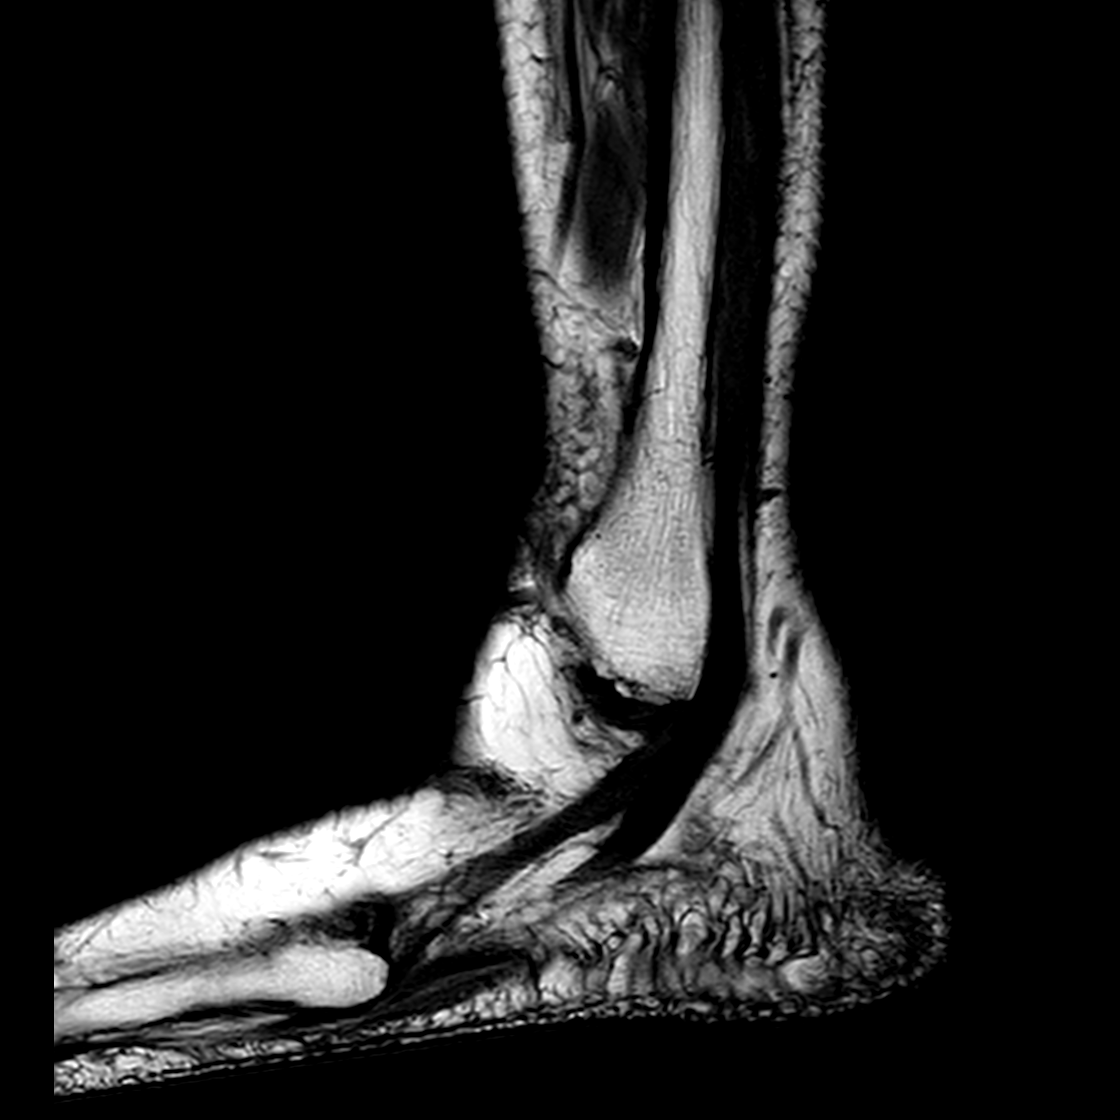
[im 13/25]
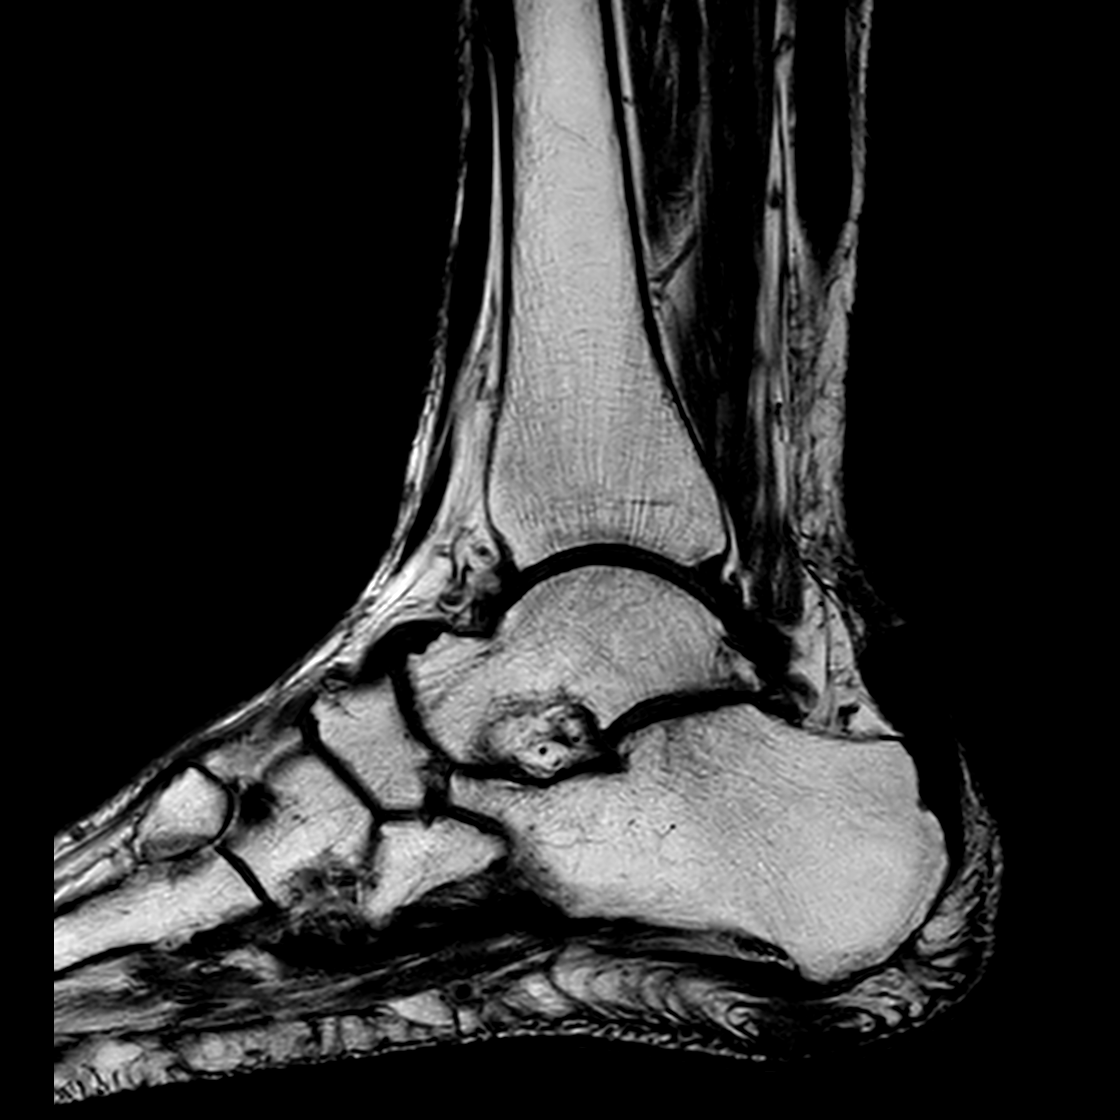
[im 21/25]
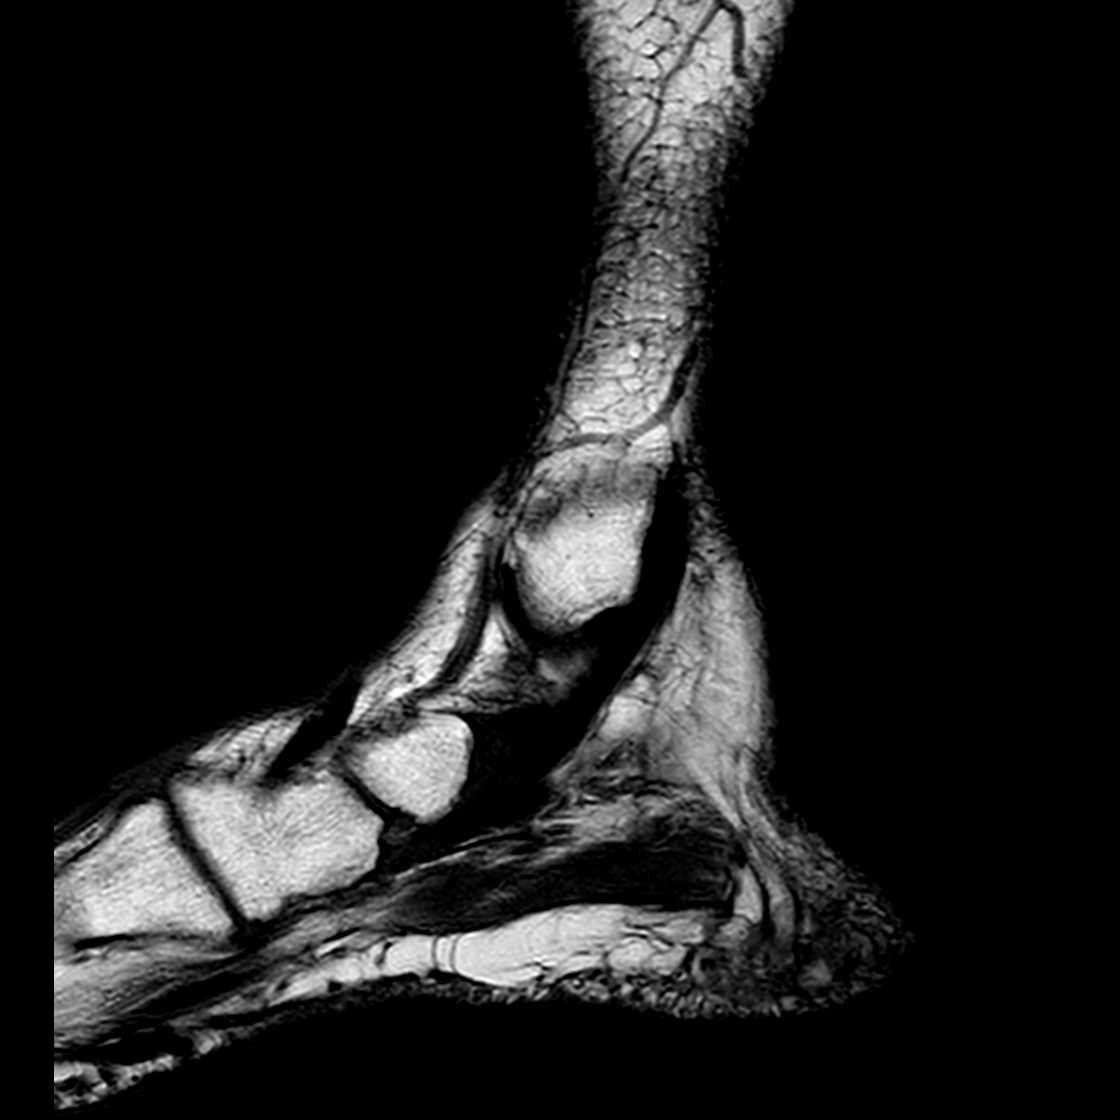

[Series 401: pd_fs_sag · sagittal · 2.5mm · 0.44mm/px · 3 of 25 slices shown]
[im 5/25]
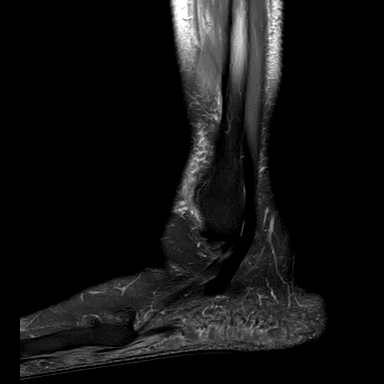
[im 13/25]
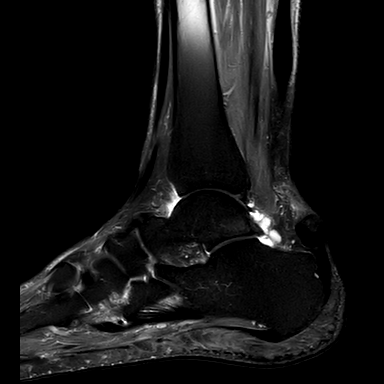
[im 21/25]
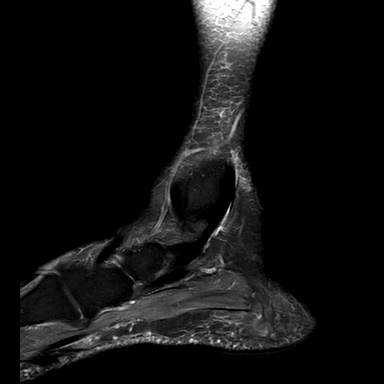

[9 of 40 positions shown; findings below may reference images not displayed]

FINDINGS: TENDONS: Chronic rupture of the critical zone of the Achilles tendon with up to 
1.6 cm distraction of the posterior fibers and 7.3 cm proximal retraction of the 
anterior fibers. Intermediate signal intensity granulation/scar tissue along the 
tendon gap. The remaining Achilles tendon is thickened up to 1.0 cm proximally 
and 1.2 cm distally in AP dimensions, consistent with tendinosis. The flexor, 
extensor and peroneal tendons are intact. No tendon tear, tenosynovitis or 
tendinopathy. No tendon subluxation.  
LIGAMENTS:  
LATERAL LIGAMENTS: The anterior talofibular ligament is intact. The 
calcaneofibular ligament and posterior talofibular ligament are preserved. 
SYNDESMOTIC LIGAMENTS: The anterior and posterior tibiofibular and interosseous 
ligaments are preserved. 
DELTOID LIGAMENTOUS COMPLEX: The deep and superficial components of the deltoid 
ligamentous complex are intact. 
SINUS TARSI LIGAMENTS: The cervical and interosseous ligaments are preserved. 
The inferior extensor retinaculum appears intact.  
FORBAS LIGAMENTOUS COMPLEX: Plantar calcaneonavicular ligament preserved. 
BONES AND JOINTS: Mild degenerative change of the ankle with partial thickness 
chondromalacia. Small ankle/subtalar joint effusion. Prominent superior 
calcaneal vascular. Mild multifocal degenerative change of the midfoot. 0.7 cm 
subcortical cyst in the central navicular with overlying proximal articular 
cartilage defect. Small plantar calcaneal enthesophyte. No fracture, contusion 
or stress response. The talar dome is preserved. No focal osteochondral lesion. 
Articular cartilage is preserved. 
ADDITIONAL FINDINGS: Thickening of the proximal central band of the plantar 
fascia (0.7 cm, consistent with chronic plantar fasciitis. No perifascial edema. 
Plantar calcaneal fat pad induration. Musculature is symmetric without mass, 
signal abnormality or atrophy. Sinus tarsi fat is preserved. Tarsal tunnel is 
preserved. Mild subcutaneous soft tissue swelling.
IMPRESSION: 1.  Chronic rupture of the critical zone of the Achilles tendon with up to
cm distraction of the posterior fibers and 7.3 cm proximal retraction of the 
anterior fibers. Granulation/scar tissue along the tendon gap and tendinosis of 
the remaining tendon. 
2.  Ankle/subtalar joint effusion.  
3.  Mild multifocal degenerative change of the midfoot.  
4.  Chronic plantar fasciitis, small plantar calcaneal enthesophyte and 
induration of the plantar heel fat pad.

## 2022-06-06 ENCOUNTER — Telehealth (HOSPITAL_BASED_OUTPATIENT_CLINIC_OR_DEPARTMENT_OTHER): Payer: Self-pay | Admitting: "Endocrinology

## 2022-06-06 NOTE — Telephone Encounter (Signed)
Received Eye Exam done on 06/04/2022. Scanned & Routing to Provider @ this time.Merleen Milliner, [place]  06/06/2022, 07:29

## 2022-06-08 ENCOUNTER — Other Ambulatory Visit (HOSPITAL_BASED_OUTPATIENT_CLINIC_OR_DEPARTMENT_OTHER): Payer: Self-pay | Admitting: "Endocrinology

## 2022-06-08 DIAGNOSIS — E785 Hyperlipidemia, unspecified: Secondary | ICD-10-CM

## 2022-06-11 NOTE — Telephone Encounter (Signed)
Current Outpatient Medications   Medication Sig    aspirin (ECOTRIN) 81 mg Oral Tablet, Delayed Release (E.C.) Take 1 Tablet (81 mg total) by mouth Once a day    atorvastatin (LIPITOR) 20 mg Oral Tablet TAKE 1 TABLET BY MOUTH EVERY DAY    Blood-Glucose Sensor (DEXCOM G7 SENSOR) Does not apply Device Change every 10 days    cholecalciferol, Vitamin D3, 125 mcg (5,000 unit) Oral Tablet Take 2 Tablets (10,000 Units total) by mouth Once a day    empagliflozin (JARDIANCE) 25 mg Oral Tablet TAKE 1 TABLET BY MOUTH EVERY DAY    gabapentin (NEURONTIN) 300 mg Oral Capsule TAKE 2 CAPSULES (600 MG TOTAL) BY MOUTH EVERY NIGHT    glucagon (GLUCAGEN) 1 mg/mL Injection Recon Soln 1 mg by Intramuscular route Once, as needed for Other (Blood sugar less than 70 and cannot eat) for up to 1 dose    insulin glargine 100 unit/mL Subcutaneous Insulin Pen 20 UNITS SUBCUTANEOUSLY EVERY NIGHT    insulin pen needles (PEN NEEDLE) 32 gauge x 5/32" Needle To inject 4 times per day    lisinopriL (PRINIVIL) 2.5 mg Oral Tablet TAKE 1 TABLET (2.5 MG TOTAL) BY MOUTH EVERY DAY    metFORMIN (GLUCOPHAGE XR) 500 mg Oral Tablet Sustained Release 24 hr TAKE 4 TABLETS (2,000 MG TOTAL) BY MOUTH EVERY NIGHT    semaglutide (OZEMPIC) 2 mg/dose (8 mg/3 mL) Subcutaneous Pen Injector INJECT 2 MG SUBCUTANEOUSLY EVERY 7 DAYS     Last dept visit:  10/05/2021  Next pending dept visit: 10/10/2022  Rx: Lipitor - Pending Provider  Merleen Milliner, MA 06/11/2022, 07:02

## 2022-07-08 ENCOUNTER — Telehealth (HOSPITAL_BASED_OUTPATIENT_CLINIC_OR_DEPARTMENT_OTHER): Payer: Self-pay | Admitting: "Endocrinology

## 2022-07-08 NOTE — Telephone Encounter (Signed)
Received Eye Exam done on 07/05/2022. Scanned in to Chart & Routing to Provider @ this time.Neita Goodnight, [place]  07/08/2022, 14:32

## 2022-08-02 ENCOUNTER — Encounter (HOSPITAL_COMMUNITY): Payer: Self-pay

## 2022-08-14 ENCOUNTER — Telehealth (HOSPITAL_BASED_OUTPATIENT_CLINIC_OR_DEPARTMENT_OTHER): Payer: Self-pay | Admitting: "Endocrinology

## 2022-08-14 NOTE — Telephone Encounter (Signed)
Received Eye Exam done on 08/09/2022. Scanned & Routing to Provider @ this time. Neita Goodnight, [place]  08/14/2022, 08:45

## 2022-09-16 ENCOUNTER — Telehealth (HOSPITAL_BASED_OUTPATIENT_CLINIC_OR_DEPARTMENT_OTHER): Payer: Self-pay | Admitting: "Endocrinology

## 2022-09-16 NOTE — Telephone Encounter (Signed)
Received Eye Exam Report done on 09/06/2022. Scanned & Routing to Provider @ this time. Neita Goodnight, [place]  09/16/2022, 15:34

## 2022-10-10 ENCOUNTER — Ambulatory Visit (HOSPITAL_BASED_OUTPATIENT_CLINIC_OR_DEPARTMENT_OTHER): Payer: Self-pay | Admitting: "Endocrinology

## 2022-10-30 ENCOUNTER — Telehealth (HOSPITAL_BASED_OUTPATIENT_CLINIC_OR_DEPARTMENT_OTHER): Payer: Self-pay | Admitting: "Endocrinology

## 2022-10-30 NOTE — Telephone Encounter (Signed)
Received Eye Exam done on 10/29/2022. Scanned & Routing to Provider @ this time. Neita Goodnight, [place]  10/30/2022, 14:13

## 2022-11-29 ENCOUNTER — Other Ambulatory Visit (HOSPITAL_BASED_OUTPATIENT_CLINIC_OR_DEPARTMENT_OTHER): Payer: Self-pay | Admitting: "Endocrinology

## 2022-11-29 DIAGNOSIS — E139 Other specified diabetes mellitus without complications: Secondary | ICD-10-CM

## 2023-01-10 ENCOUNTER — Other Ambulatory Visit (HOSPITAL_BASED_OUTPATIENT_CLINIC_OR_DEPARTMENT_OTHER): Payer: Self-pay | Admitting: "Endocrinology

## 2023-01-10 DIAGNOSIS — E139 Other specified diabetes mellitus without complications: Secondary | ICD-10-CM

## 2023-01-13 NOTE — Telephone Encounter (Signed)
Current Outpatient Medications   Medication Sig    aspirin (ECOTRIN) 81 mg Oral Tablet, Delayed Release (E.C.) Take 1 Tablet (81 mg total) by mouth Once a day    atorvastatin (LIPITOR) 20 mg Oral Tablet TAKE 1 TABLET BY MOUTH EVERY DAY    Blood-Glucose Sensor (DEXCOM G7 SENSOR) Does not apply Device Change every 10 days    cholecalciferol, Vitamin D3, 125 mcg (5,000 unit) Oral Tablet Take 2 Tablets (10,000 Units total) by mouth Once a day    empagliflozin (JARDIANCE) 25 mg Oral Tablet TAKE ONE TABLET BY MOUTH EVERY DAY    gabapentin (NEURONTIN) 300 mg Oral Capsule TAKE 2 CAPSULES (600 MG TOTAL) BY MOUTH EVERY NIGHT    glucagon (GLUCAGEN) 1 mg/mL Injection Recon Soln 1 mg by Intramuscular route Once, as needed for Other (Blood sugar less than 70 and cannot eat) for up to 1 dose    insulin glargine 100 unit/mL Subcutaneous Insulin Pen 20 UNITS SUBCUTANEOUSLY EVERY NIGHT    insulin pen needles (PEN NEEDLE) 32 gauge x 5/32" Needle To inject 4 times per day    lisinopriL (PRINIVIL) 2.5 mg Oral Tablet TAKE 1 TABLET (2.5 MG TOTAL) BY MOUTH EVERY DAY    metFORMIN (GLUCOPHAGE XR) 500 mg Oral Tablet Sustained Release 24 hr TAKE 4 TABLETS (2,000 MG TOTAL) BY MOUTH EVERY NIGHT    semaglutide (OZEMPIC) 2 mg/dose (8 mg/3 mL) Subcutaneous Pen Injector INJECT 2 MG SUBCUTANEOUSLY EVERY 7 DAYS     Last dept visit:  10/05/2021  Next pending dept visit: Visit date not found  Refill request for Ozempic and Lisinopril  [employer], RN 01/13/2023, 15:09

## 2023-02-24 ENCOUNTER — Other Ambulatory Visit (HOSPITAL_BASED_OUTPATIENT_CLINIC_OR_DEPARTMENT_OTHER): Payer: Self-pay | Admitting: "Endocrinology

## 2023-02-24 DIAGNOSIS — E139 Other specified diabetes mellitus without complications: Secondary | ICD-10-CM

## 2023-02-28 NOTE — Telephone Encounter (Signed)
Current Outpatient Medications   Medication Sig    aspirin (ECOTRIN) 81 mg Oral Tablet, Delayed Release (E.C.) Take 1 Tablet (81 mg total) by mouth Once a day    atorvastatin (LIPITOR) 20 mg Oral Tablet TAKE 1 TABLET BY MOUTH EVERY DAY    Blood-Glucose Sensor (DEXCOM G7 SENSOR) Does not apply Device Change every 10 days    cholecalciferol, Vitamin D3, 125 mcg (5,000 unit) Oral Tablet Take 2 Tablets (10,000 Units total) by mouth Once a day    empagliflozin (JARDIANCE) 25 mg Oral Tablet TAKE ONE TABLET BY MOUTH EVERY DAY    gabapentin (NEURONTIN) 300 mg Oral Capsule TAKE 2 CAPSULES (600 MG TOTAL) BY MOUTH EVERY NIGHT    glucagon (GLUCAGEN) 1 mg/mL Injection Recon Soln 1 mg by Intramuscular route Once, as needed for Other (Blood sugar less than 70 and cannot eat) for up to 1 dose    insulin glargine 100 unit/mL Subcutaneous Insulin Pen 20 UNITS SUBCUTANEOUSLY EVERY NIGHT    insulin pen needles (PEN NEEDLE) 32 gauge x 5/32" Needle To inject 4 times per day    lisinopriL (PRINIVIL) 2.5 mg Oral Tablet TAKE 1 TABLET BY MOUTH EVERY DAY    metFORMIN (GLUCOPHAGE XR) 500 mg Oral Tablet Sustained Release 24 hr TAKE 4 TABLETS (2,000 MG TOTAL) BY MOUTH EVERY NIGHT    OZEMPIC 2 mg/dose (8 mg/3 mL) Subcutaneous Pen Injector INJECT 2 MG SUBCUTANEOUSLY EVERY 7 DAYS     Last dept visit:  10/05/2021  Next pending dept visit: Visit date not found  Request refill for insulin glargine   Jolaine Artist, Ambulatory Care Assistant 02/28/2023, 09:25

## 2024-01-06 ENCOUNTER — Other Ambulatory Visit (HOSPITAL_BASED_OUTPATIENT_CLINIC_OR_DEPARTMENT_OTHER): Payer: Self-pay | Admitting: "Endocrinology

## 2024-01-06 ENCOUNTER — Encounter (HOSPITAL_BASED_OUTPATIENT_CLINIC_OR_DEPARTMENT_OTHER): Payer: Self-pay

## 2024-01-06 DIAGNOSIS — E139 Other specified diabetes mellitus without complications: Secondary | ICD-10-CM

## 2024-01-06 NOTE — Telephone Encounter (Signed)
 Jardiance  25 mg; patient needs an appointment, last seen in 2023

## 2024-02-06 ENCOUNTER — Telehealth (HOSPITAL_BASED_OUTPATIENT_CLINIC_OR_DEPARTMENT_OTHER): Payer: Self-pay | Admitting: "Endocrinology
